# Patient Record
Sex: Male | Born: 1945 | Race: White | Hispanic: No | Marital: Married | Smoking: Current some day smoker
Health system: Southern US, Community
[De-identification: ages and names within clinical notes are randomized; demographics above are authoritative.]

## PROBLEM LIST (undated history)

## (undated) DIAGNOSIS — K2270 Barrett's esophagus without dysplasia: Secondary | ICD-10-CM

## (undated) DIAGNOSIS — K219 Gastro-esophageal reflux disease without esophagitis: Secondary | ICD-10-CM

## (undated) DIAGNOSIS — J309 Allergic rhinitis, unspecified: Secondary | ICD-10-CM

## (undated) DIAGNOSIS — N529 Male erectile dysfunction, unspecified: Secondary | ICD-10-CM

## (undated) DIAGNOSIS — K5790 Diverticulosis of intestine, part unspecified, without perforation or abscess without bleeding: Secondary | ICD-10-CM

## (undated) DIAGNOSIS — Z86010 Personal history of colonic polyps: Secondary | ICD-10-CM

## (undated) DIAGNOSIS — N401 Enlarged prostate with lower urinary tract symptoms: Secondary | ICD-10-CM

## (undated) DIAGNOSIS — Z974 Presence of external hearing-aid: Secondary | ICD-10-CM

## (undated) DIAGNOSIS — K648 Other hemorrhoids: Secondary | ICD-10-CM

## (undated) DIAGNOSIS — Z87448 Personal history of other diseases of urinary system: Secondary | ICD-10-CM

## (undated) DIAGNOSIS — E785 Hyperlipidemia, unspecified: Secondary | ICD-10-CM

## (undated) DIAGNOSIS — K5730 Diverticulosis of large intestine without perforation or abscess without bleeding: Secondary | ICD-10-CM

## (undated) DIAGNOSIS — R42 Dizziness and giddiness: Secondary | ICD-10-CM

## (undated) DIAGNOSIS — Z8489 Family history of other specified conditions: Secondary | ICD-10-CM

## (undated) DIAGNOSIS — Z973 Presence of spectacles and contact lenses: Secondary | ICD-10-CM

## (undated) DIAGNOSIS — Z87442 Personal history of urinary calculi: Secondary | ICD-10-CM

## (undated) DIAGNOSIS — N200 Calculus of kidney: Secondary | ICD-10-CM

## (undated) DIAGNOSIS — D126 Benign neoplasm of colon, unspecified: Secondary | ICD-10-CM

## (undated) DIAGNOSIS — Z860101 Personal history of adenomatous and serrated colon polyps: Secondary | ICD-10-CM

## (undated) DIAGNOSIS — H9313 Tinnitus, bilateral: Secondary | ICD-10-CM

## (undated) HISTORY — DX: Calculus of kidney: N20.0

## (undated) HISTORY — DX: Diverticulosis of intestine, part unspecified, without perforation or abscess without bleeding: K57.90

## (undated) HISTORY — DX: Other hemorrhoids: K64.8

## (undated) HISTORY — DX: Gastro-esophageal reflux disease without esophagitis: K21.9

## (undated) HISTORY — DX: Tinnitus, bilateral: H93.13

## (undated) HISTORY — PX: UPPER GI ENDOSCOPY: SHX6162

## (undated) HISTORY — DX: Barrett's esophagus without dysplasia: K22.70

## (undated) HISTORY — DX: Benign neoplasm of colon, unspecified: D12.6

---

## 1976-09-26 HISTORY — PX: LUMBAR LAMINECTOMY: SHX95

## 1984-09-26 HISTORY — PX: LUMBAR LAMINECTOMY: SHX95

## 2001-12-03 ENCOUNTER — Encounter (INDEPENDENT_AMBULATORY_CARE_PROVIDER_SITE_OTHER): Payer: Self-pay

## 2001-12-03 ENCOUNTER — Ambulatory Visit (HOSPITAL_COMMUNITY): Admission: RE | Admit: 2001-12-03 | Discharge: 2001-12-03 | Payer: Self-pay | Admitting: *Deleted

## 2002-10-03 ENCOUNTER — Ambulatory Visit (HOSPITAL_BASED_OUTPATIENT_CLINIC_OR_DEPARTMENT_OTHER): Admission: RE | Admit: 2002-10-03 | Discharge: 2002-10-04 | Payer: Self-pay | Admitting: Urology

## 2002-10-03 ENCOUNTER — Encounter: Payer: Self-pay | Admitting: Urology

## 2006-04-21 ENCOUNTER — Encounter (INDEPENDENT_AMBULATORY_CARE_PROVIDER_SITE_OTHER): Payer: Self-pay | Admitting: *Deleted

## 2006-04-21 ENCOUNTER — Ambulatory Visit (HOSPITAL_COMMUNITY): Admission: RE | Admit: 2006-04-21 | Discharge: 2006-04-21 | Payer: Self-pay | Admitting: *Deleted

## 2006-09-26 HISTORY — PX: ABDOMINAL HERNIA REPAIR: SHX539

## 2008-01-01 ENCOUNTER — Ambulatory Visit (HOSPITAL_COMMUNITY): Admission: RE | Admit: 2008-01-01 | Discharge: 2008-01-01 | Payer: Self-pay | Admitting: *Deleted

## 2008-01-01 ENCOUNTER — Encounter (INDEPENDENT_AMBULATORY_CARE_PROVIDER_SITE_OTHER): Payer: Self-pay | Admitting: *Deleted

## 2009-03-17 ENCOUNTER — Encounter (INDEPENDENT_AMBULATORY_CARE_PROVIDER_SITE_OTHER): Payer: Self-pay | Admitting: *Deleted

## 2009-03-17 ENCOUNTER — Ambulatory Visit (HOSPITAL_COMMUNITY): Admission: RE | Admit: 2009-03-17 | Discharge: 2009-03-17 | Payer: Self-pay | Admitting: *Deleted

## 2011-02-08 NOTE — Op Note (Signed)
Henry [name], Henry [name]                 ACCOUNT NO.:  [id number]   MEDICAL RECORD NO.:  [id number]          PATIENT TYPE:  AMB   LOCATION:  ENDO                         FACILITY:  [facility]   PHYSICIAN:  Georgiana Spinner, M.D.    DATE OF BIRTH:  [birth year]   DATE OF PROCEDURE:  03/17/2009  DATE OF DISCHARGE:                               OPERATIVE REPORT   PROCEDURE:  Colonoscopy.   INDICATIONS:  Colon polyps.   ANESTHESIA:  Fentanyl 50 mcg, Versed 4 mg.   With the patient mildly sedated in the left lateral decubitus position,  the Pentax videoscopic pediatric colonoscope was inserted in the rectum  and passed under direct vision to the cecum with pressure applied.  The  cecum was identified by ileocecal valve and appendiceal orifice.  We  easily entered into the terminal ileum which appeared normal as well and  was photographed.  From this point, the colonoscope was slowly withdrawn  taking circumferential views of colonic mucosa stopping only at the  hepatic flexure where a polyp was seen, photographed and removed using  hot biopsy forceps technique setting of 20/150 blended current.  We then  withdrew the colonoscope, taking circumferential views of the remaining  colonic mucosa, stopping in the rectum which appeared normal on direct,  showing hemorrhoids on retroflexed view.  The endoscope was straightened  and withdrawn.  The patient's vital signs, pulse oximeter remained  stable.  The patient tolerated procedure well without apparent  complications.   FINDINGS:  Rare diverticulum in the sigmoid colon, internal hemorrhoids  and polyp of hepatic flexure.   PLAN:  Await biopsy report.  The patient will call me for results and  follow-up with me as an outpatient.           ______________________________  Georgiana Spinner, M.D.     GMO/MEDQ  D:  03/17/2009  T:  03/18/2009  Job:  [id number]

## 2011-02-08 NOTE — Op Note (Signed)
NAMEJOACHIM, [name]                 ACCOUNT NO.:  [id number]   MEDICAL RECORD NO.:  [id number]          PATIENT TYPE:  AMB   LOCATION:  ENDO                         FACILITY:  [facility]   PHYSICIAN:  Georgiana Spinner, M.D.    DATE OF BIRTH:  08-29-1946   DATE OF PROCEDURE:  DATE OF DISCHARGE:                               OPERATIVE REPORT   PROCEDURE:  Upper endoscopy.   INDICATIONS:  Gastroesophageal reflux disease with Barrett's esophagus.   ANESTHESIA:  Fentanyl 75 mcg, Versed 8 mg, Benadryl 25 mg.   PROCEDURE:  With the patient mildly sedated in the left lateral  decubitus position, the Pentax videoscopic endoscope was inserted in the  mouth, passed under direct vision through the esophagus which appeared  normal until we reached distal esophagus and there was change of  Barrett's esophagus island that was photographed and subsequently  biopsied.  We entered into the stomach.  The fundus, body, antrum,  duodenal bulb, second portion duodenum were visualized.  From this point  the endoscope was slowly withdrawn, taking circumferential views of  duodenal mucosa until the endoscope had been pulled back into the  stomach placed in retroflexion to view the stomach from below.  The  endoscope was then straightened and withdrawn taking circumferential  views of remaining gastric and esophageal mucosa stopping in the antrum.  Where a reddened area was seen.  At first I thought it might be an ulcer  but appeared just to be a localized redness in the prepyloric area which  I elected to photograph and biopsy.  The scope was then withdrawn taking  circumferential views of the remaining gastric and esophageal mucosa.  The patient's vital signs, pulse oximeter remained stable.  The patient  tolerated the procedure well without apparent complications.   FINDINGS:  Mild erythema of the prepyloric area biopsied.  Barrett's  esophagus biopsied.  Await biopsy report.  The patient will call me for  results and follow-up with me as an outpatient.           ______________________________  Georgiana Spinner, M.D.     GMO/MEDQ  D:  03/17/2009  T:  03/18/2009  Job:  [id number]

## 2011-02-08 NOTE — Op Note (Signed)
NAME:  Henry [name], Henry [name]                 ACCOUNT NO.:  [id number]   MEDICAL RECORD NO.:  [id number]          PATIENT TYPE:  AMB   LOCATION:  NESC                         FACILITY:  [facility]   PHYSICIAN:  Georgiana Spinner, M.D.    DATE OF BIRTH:  December 22, 1945   DATE OF PROCEDURE:  DATE OF DISCHARGE:                               OPERATIVE REPORT   PROCEDURE:  Upper endoscopy.   INDICATIONS:  GERD with a history of Barrett esophagus.   ANESTHESIA:  Fentanyl 75 mcg, Versed 7.5 mg.   PROCEDURE:  With the patient mildly sedated in the left lateral  decubitus position, the Pentax videoscopic endoscope was inserted in the  mouth, passed under direct vision through the esophagus which appeared  normal until we reached distal esophagus, and there was an [place] of  what appeared to be Barrett tissue, photographed and biopsied, and a  second area that extended cephalad from the squamocolumnar junction that  looked like a Y-shaped area of Barrett esophagus.  This too was  photographed and biopsied.  We then entered into the stomach.  Fundus,  body, antrum, duodenal bulb, and second portion of duodenum all appeared  normal.  From this point the endoscope was slowly withdrawn, taking  circumferential views of duodenal mucosa until the endoscope had been  pulled back into stomach, placed in retroflexion to view the stomach  from below.  The endoscope was then straightened and withdrawn, taking  circumferential views of remaining gastric and esophageal mucosa.  The  patient's vital signs and pulse oximeter remained stable.  The patient  tolerated the procedure well without apparent complications.   FINDINGS:  Barrett esophagus with loose wrap of the GE junction  indicating laxity of the lower esophageal sphincter, otherwise an  unremarkable exam.   PLAN:  Await biopsy report.  The patient will call me for results and  follow up with me as needed as an outpatient.            ______________________________  Georgiana Spinner, M.D.     GMO/MEDQ  D:  01/01/2008  T:  01/01/2008  Job:  [id number]

## 2011-02-11 NOTE — Procedures (Signed)
[facility]  Patient:    Henry [name], Henry [name] Visit Number: [id number] MRN: [id number]          Service Type: Attending:  Sabino Gasser, M.D. Dictated by:   Sabino Gasser, M.D. Proc. Date: 12/03/01                             Procedure Report  PROCEDURE:  Endoscopy.  INDICATIONS FOR PROCEDURE:  Reflux.  ANESTHESIA:  Demerol 40, Versed 5 mg.  DESCRIPTION OF PROCEDURE:  With the patient mildly sedated in the left lateral decubitus position, the Olympus video endoscope was inserted in the mouth and passed under direct vision through the esophagus which appeared normal until we reached the distal esophagus and there were some islands of Barretts seen, photographed, and biopsied. We entered into the stomach. The fundus, body, antrum, duodenal bulb and second portion of the duodenum all appeared normal and were photographed. From this point, the endoscope was slowly withdrawn taking circumferential views of the entire duodenal mucosa until the endoscope was then pulled back in the stomach and placed in retroflexion to view the stomach from below. The endoscope was then straightened and withdrawn taking circumferential views of the remaining gastric and esophageal mucosa. The patients vital signs and pulse oximeter remained stable. The patient tolerated the procedure well without apparent complications.  FINDINGS:  Barretts esophagus photographed and biopsied. Await biopsy report. The patient will call me for results and followup with me as an outpatient. Proceed to colonoscopy as planned. Dictated by:   Sabino Gasser, M.D. Attending:  Sabino Gasser, M.D. DD:  12/03/01 TD:  12/03/01 Job: [id number]

## 2011-02-11 NOTE — Op Note (Signed)
NAME:  Henry [name], Henry [name]                           ACCOUNT NO.:  [id number]   MEDICAL RECORD NO.:  [id number]                   PATIENT TYPE:  AMB   LOCATION:  NESC                                 FACILITY:  [facility]   PHYSICIAN:  Excell Seltzer. Annabell Howells, M.D.                 DATE OF BIRTH:  1946/01/08   DATE OF PROCEDURE:  10/03/2002  DATE OF DISCHARGE:                                 OPERATIVE REPORT   PREOPERATIVE DIAGNOSIS:  Right ureteral calculus.   POSTOPERATIVE DIAGNOSIS:  Right ureteral calculus.   PROCEDURES:  1. Cystoscopy.  2. Right retrograde pyelography.  3. Removal of bladder calculus.   SURGEON:  Excell Seltzer. Annabell Howells, M.D.   ASSISTANT:  Crecencio Mc, MD   ANESTHESIA:  General.   COMPLICATIONS:  None.   INDICATION:  [name] is a gentleman, who recently presented with right-  sided flank and after undergoing evaluation, was found to have a right  ureteral calculus in the distal ureter.  After discussing options, the  patient elected to proceed with ureteroscopic removal.  Potential risks and  benefits of this procedure were explained to the patient, and he consented.   DESCRIPTION OF PROCEDURE:  The patient was taken to the operating room, and  a general anesthetic was administered.  The patient was given preoperative  antibiotics and placed in the dorsal lithotomy position and prepped and  draped in the usual sterile fashion.  Next, cystoscopy was performed with  the 22 French sheath.  This revealed no obvious bladder pathology.  Attention was turned to the right ureteral orifice, and a right retrograde  pyelogram was performed utilizing an end-hole catheter.  This demonstrated  no filling defects or dilation.  Inspection of the bladder revealed no  evidence of any bladder tumors or mucosal pathology.  However, a bladder  calculus was seen.  This was most likely the patient's ureteral calculus  that had passed into the bladder.  It was decided to perform a right  retrograde  pyelogram to confirm this diagnosis, as the patient had had a  preoperative KUB that demonstrated his calculus to be in the distal right  ureter.  Therefore, a 6 French end-hole catheter was used to perform  retrograde pyelography.  This demonstrated no evidence of any filling  defects or dilation of the right ureter.  Real-time fluoroscopy was used,  and the ureter was seen to empty without problems.  Therefore, the bladder  calculus was removed from the bladder via the cystoscope sheath.  The  procedure was then ended.  There were no  complications, and the patient appeared to tolerate the procedure well.  He  was able to be transferred to the recovery unit in satisfactory condition.  Please note that Dr. Bjorn Pippin was the operating surgeon and was present  and participated in this entire procedure.     Crecencio Mc, M.D.  Excell Seltzer. Annabell Howells, M.D.    LB/MEDQ  D:  10/03/2002  T:  10/03/2002  Job:  [id number]

## 2011-02-11 NOTE — Op Note (Signed)
NAME:  Henry [name], Henry [name]                 ACCOUNT NO.:  [id number]   MEDICAL RECORD NO.:  [id number]          PATIENT TYPE:  AMB   LOCATION:  ENDO                         FACILITY:  [facility]   PHYSICIAN:  Georgiana Spinner, M.D.    DATE OF BIRTH:  09/04/1946   DATE OF PROCEDURE:  DATE OF DISCHARGE:                                 OPERATIVE REPORT   PROCEDURE:  Upper endoscopy.   INDICATIONS:  GERD.   ANESTHESIA:  Fentanyl 100 mcg, Versed 10 mg.   PROCEDURE:  With the patient mildly sedated, in the left lateral decubitus  position, the Olympus video endoscope was inserted into the mouth and passed  under direct vision through the esophagus, which appeared normal until we  reached the distal esophagus and there appeared to be changes of Barrett's  esophagus, photographed and biopsied.  We entered into the stomach through a  hiatal hernia.  Fundus, body, antrum, duodenal bulb, and second portion of  the duodenum were visualized.  From this point, the endoscope was slowly  withdrawn, taking circumferential views of the duodenal mucosa until the  endoscope had been pulled back into the stomach and placed in retroflexion  to view the stomach from below.  The endoscope was straightened and  withdrawn, taking circumferential views of remaining gastric and esophageal  mucosa.  The patient's vital signs and pulse oximeter remained stable.  The  patient tolerated the procedure well with no apparent complications.   FINDINGS:  Hiatal hernia with Barrett's esophagus, await biopsy report.  The  patient will call me for results and follow up with me as an outpatient to  proceed with colonoscopy.           ______________________________  Georgiana Spinner, M.D.     GMO/MEDQ  D:  04/21/2006  T:  04/22/2006  Job:  [id number]

## 2011-02-11 NOTE — Procedures (Signed)
[facility]  Patient:    CAUSER, Yoltzin R. Visit Number: [id number] MRN: [id number]          Service Type: Attending:  Sabino Gasser, M.D. Dictated by:   Sabino Gasser, M.D. Proc. Date: 12/03/01                             Procedure Report  PROCEDURE:  Colonoscopy.  INDICATION FOR PROCEDURE:  Colon polyps.  ANESTHESIA:  Demerol 20, Versed 1 mg additionally.  DESCRIPTION OF PROCEDURE:  With the patient mildly sedated in the left lateral decubitus position, the Olympus videoscopic colonoscope was inserted in the rectum after a normal rectal exam and passed under direct vision to the cecum. The cecum was identified by the ileocecal valve and appendiceal orifice both of which were photographed. From this point, the colonoscope was slowly withdrawn taking circumferential views of the entire colonic mucosa, stopping only in the rectum which appeared normal in direct and showed hemorrhoid on retroflexed view. The endoscope was straightened and withdrawn. The patients vital signs and pulse oximeter remained stable. The patient tolerated the procedure well without apparent complications.  FINDINGS:  Internal hemorrhoids otherwise unremarkable colonoscopic examination.  PLAN:  Repeat examination in five years. Dictated by:   Sabino Gasser, M.D. Attending:  Sabino Gasser, M.D. DD:  12/03/01 TD:  12/03/01 Job: [id number]

## 2011-02-11 NOTE — Op Note (Signed)
Henry [name], Henry [name]                 ACCOUNT NO.:  [id number]   MEDICAL RECORD NO.:  [id number]          PATIENT TYPE:  AMB   LOCATION:  ENDO                         FACILITY:  [facility]   PHYSICIAN:  Georgiana Spinner, M.D.    DATE OF BIRTH:  04-14-1946   DATE OF PROCEDURE:  04/21/2006  DATE OF DISCHARGE:                                 OPERATIVE REPORT   PROCEDURE:  Colonoscopy.   ENDOSCOPIST:  Georgiana Spinner, M.D.   INDICATIONS:  Colon polyps.   ANESTHESIA:  Fentanyl 25 mcg, Versed 2 mg.   PROCEDURE:  With the patient mildly sedated in the left lateral decubitus  position, a rectal exam was performed which was unremarkable.  Subsequently,  the Olympus videoscopic colonoscope was inserted in the rectum and with  pressure applied, we reached the cecum, identified by ileocecal valve and  appendiceal orifice, both of which were photographed.  From this point, the  colonoscope was slowly withdrawn, taking circumferential views of the  colonic mucosa, stopping in the descending colon, just distal to the splenic  flexure, where a polyp was seen, photographed and removed using snare  cautery technique, setting of 20/200 blended current.  I then used the hot  biopsy forceps to touch the base where the polyp been removed to cauterize  it and then the endoscope was then withdrawn, taking circumferential views  of the remaining colonic mucosa, after we had suctioned the polyp through  the endoscope.  We stopped only in the sigmoid colon to photograph  diverticulosis noted that, until we reached the rectum, which appeared  normal on direct and showed hemorrhoids on retroflexed view.  The endoscope  was straightened and withdrawn.  The patient's vital signs and pulse  oximetry remained stable.  The patient tolerated procedure well without  apparent complications.   FINDINGS:  1.  Internal hemorrhoids.  2.  Diverticulosis of sigmoid colon, mild.  3.  Polyp of descending colon near splenic flexure,  removed, await biopsy      report.  The patient will call me for results and follow up with me as      an outpatient.           ______________________________  Georgiana Spinner, M.D.     GMO/MEDQ  D:  04/21/2006  T:  04/21/2006  Job:  [id number]

## 2011-10-31 ENCOUNTER — Encounter: Payer: Self-pay | Admitting: Gastroenterology

## 2011-11-10 ENCOUNTER — Encounter: Payer: Self-pay | Admitting: Gastroenterology

## 2011-11-10 ENCOUNTER — Ambulatory Visit (INDEPENDENT_AMBULATORY_CARE_PROVIDER_SITE_OTHER): Payer: Medicare Other | Admitting: Gastroenterology

## 2011-11-10 DIAGNOSIS — Z860100 Personal history of colon polyps, unspecified: Secondary | ICD-10-CM | POA: Insufficient documentation

## 2011-11-10 DIAGNOSIS — K2270 Barrett's esophagus without dysplasia: Secondary | ICD-10-CM

## 2011-11-10 MED ORDER — PEG-KCL-NACL-NASULF-NA ASC-C 100 G PO SOLR: 1.0000 | Freq: Once | ORAL | Status: DC

## 2011-11-10 NOTE — Assessment & Plan Note (Signed)
Plan repeat endoscopy.

## 2011-11-10 NOTE — Progress Notes (Signed)
History of Present Illness: Henry [name] is a pleasant 66 year old white male with history of Barrett's esophagus and colon polyps referred at the request of Dr. Renne Crigler for followup procedures. Last endoscopy in 2010 demonstrated Barrett's without dysplasia. He's had recurrent polyps over the years and was last examined in 2010 where adenomatous polyps were seen and removed. He has no GI complaints except for occasional pyrosis if he eats late at night. He remains on omeprazole.    Past Medical History  Diagnosis Date  . Internal hemorrhoids   . Diverticulosis   . Barrett's esophagus   . GERD (gastroesophageal reflux disease)   . Adenomatous colon polyp   . Kidney stone    Past Surgical History  Procedure Date  . Lumbar laminectomy   . Abdominal hernia repair    family history includes Alcohol abuse in his father; Alzheimer's disease in his mother; and Brain cancer in his paternal grandfather. Current Outpatient Prescriptions  Medication Sig Dispense Refill  . omeprazole (PRILOSEC) 20 MG capsule Take 20 mg by mouth daily.      . tadalafil (CIALIS) 20 MG tablet Take 20 mg by mouth as needed.       Allergies as of 11/10/2011  . (No Known Allergies)    reports that he quit smoking about 20 years ago. He has never used smokeless tobacco. He reports that he does not drink alcohol or use illicit drugs.     Review of Systems: Pertinent positive and negative review of systems were noted in the above HPI section. All other review of systems were otherwise negative.  Vital signs were reviewed in today's medical record Physical Exam: General: Well developed , well nourished, no acute distress Head: Normocephalic and atraumatic Eyes:  sclerae anicteric, EOMI Ears: Normal auditory acuity Mouth: No deformity or lesions Neck: Supple, no masses or thyromegaly Lungs: Clear throughout to auscultation Heart: Regular rate and rhythm; no murmurs, rubs or bruits Abdomen: Soft, non tender and non  distended. No masses, hepatosplenomegaly or hernias noted. Normal Bowel sounds Rectal:deferred Musculoskeletal: Symmetrical with no gross deformities  Skin: No lesions on visible extremities Pulses:  Normal pulses noted Extremities: No clubbing, cyanosis, edema or deformities noted Neurological: Alert oriented x 4, grossly nonfocal Cervical Nodes:  No significant cervical adenopathy Inguinal Nodes: No significant inguinal adenopathy Psychological:  Alert and cooperative. Normal mood and affect

## 2011-11-10 NOTE — Assessment & Plan Note (Signed)
Plan colonoscopy 

## 2011-11-29 ENCOUNTER — Encounter: Payer: Self-pay | Admitting: Gastroenterology

## 2011-11-29 ENCOUNTER — Ambulatory Visit (AMBULATORY_SURGERY_CENTER): Payer: Medicare Other | Admitting: Gastroenterology

## 2011-11-29 DIAGNOSIS — K219 Gastro-esophageal reflux disease without esophagitis: Secondary | ICD-10-CM

## 2011-11-29 DIAGNOSIS — Z1211 Encounter for screening for malignant neoplasm of colon: Secondary | ICD-10-CM

## 2011-11-29 DIAGNOSIS — K2270 Barrett's esophagus without dysplasia: Secondary | ICD-10-CM

## 2011-11-29 DIAGNOSIS — Z86010 Personal history of colonic polyps: Secondary | ICD-10-CM

## 2011-11-29 MED ORDER — SODIUM CHLORIDE 0.9 % IV SOLN: 500.0000 mL | INTRAVENOUS | Status: DC

## 2011-11-29 NOTE — Progress Notes (Signed)
Patient did not experience any of the following events: a burn prior to discharge; a fall within the facility; wrong site/side/patient/procedure/implant event; or a hospital transfer or hospital admission upon discharge from the facility. (G8907) Patient did not have preoperative order for IV antibiotic SSI prophylaxis. (G8918)  

## 2011-11-29 NOTE — Op Note (Signed)
[facility] [address] [place], [place]  [address]  COLONOSCOPY PROCEDURE REPORT  PATIENT:  Henry [name], Henry [name]  MR#:  [id number] BIRTHDATE:  March 27, 1946, 66 yrs. old  GENDER:  male ENDOSCOPIST:  Barbette Hair. Arlyce Dice, MD REF. BY:  Soyla Murphy. Renne Crigler, M.D. PROCEDURE DATE:  11/29/2011 PROCEDURE:  Diagnostic Colonoscopy ASA CLASS:  Class II INDICATIONS:  Screening, history of pre-cancerous (adenomatous) colon polyps 2010 colo - adenomatous polyps MEDICATIONS:   MAC sedation, administered by CRNA propofol 300mg IV  DESCRIPTION OF PROCEDURE:   After the risks benefits and alternatives of the procedure were thoroughly explained, informed consent was obtained.  Digital rectal exam was performed and revealed no abnormalities.   The LB CF-H180AL [id number] endoscope was introduced through the anus and advanced to the cecum, which was identified by the ileocecal valve, limited by poor preparation.  Moderate amount of retained liquid stool  The quality of the prep was Moviprep fair.  The instrument was then slowly withdrawn as the colon was fully examined. <<PROCEDUREIMAGES>>  FINDINGS:  Moderate diverticulosis was found in the sigmoid colon (see image4).  This was otherwise a normal examination of the colon (see image1, image2, and image5).   Retroflexed views in the rectum revealed no abnormalities.    The time to cecum =  1) 4.25 minutes. The scope was then withdrawn in  1) 7.0  minutes from the cecum and the procedure completed. COMPLICATIONS:  None ENDOSCOPIC IMPRESSION: 1) Moderate diverticulosis in the sigmoid colon 2) Otherwise normal examination RECOMMENDATIONS: 1) Colonoscopy in 5 years in view of slight limitation of exam due to prep REPEAT EXAM:  In 5 year(s) for Colonoscopy.  ______________________________ Barbette Hair. Arlyce Dice, MD  CC:  n. eSIGNED:   Barbette Hair. Eliaz Fout at 11/29/2011 02:54 PM  Gloriann Loan, [phone]

## 2011-11-29 NOTE — Patient Instructions (Signed)

## 2011-11-30 ENCOUNTER — Telehealth: Payer: Self-pay | Admitting: *Deleted

## 2011-11-30 NOTE — Telephone Encounter (Signed)
  Follow up Call-  Call back number 11/29/2011  Post procedure Call Back phone  # [phone]  Permission to leave phone message Yes     Patient questions:  Do you have a fever, pain , or abdominal swelling? no Pain Score  0 *  Have you tolerated food without any problems? yes  Have you been able to return to your normal activities? yes  Do you have any questions about your discharge instructions: Diet   no Medications  no Follow up visit  no  Do you have questions or concerns about your Care? no  Actions: * If pain score is 4 or above: No action needed, pain <4. Pt states went home and ate a big salad and had bloating and gas from the salad but passed all the air and felt fine. States everything here was great and he appreciates all we all did for him. ewm

## 2011-11-30 NOTE — Op Note (Signed)
[facility] [address] [place], [place]  [address]  ENDOSCOPY PROCEDURE REPORT  PATIENT:  Henry [name], Henry [name]  MR#:  [id number] BIRTHDATE:  15-Sep-1946, 66 yrs. old  GENDER:  male  ENDOSCOPIST:  Barbette Hair. Arlyce Dice, MD Referred by:  Soyla Murphy. Renne Crigler, M.D.  PROCEDURE DATE:  11/29/2011 PROCEDURE:  EGD with biopsy, [id number] ASA CLASS:  Class II INDICATIONS:  h/o Barrett's Esophagus  MEDICATIONS:   There was residual sedation effect present from prior procedure., MAC sedation, administered by CRNA propofol 100mg IV, glycopyrrolate (Robinal) 0.2 mg IV, 0.6cc simethancone 0.6 cc PO TOPICAL ANESTHETIC:  DESCRIPTION OF PROCEDURE:   After the risks and benefits of the procedure were explained, informed consent was obtained.  The LB GIF-H180 [id number] endoscope was introduced through the mouth and advanced to the third portion of the duodenum.  The instrument was slowly withdrawn as the mucosa was fully examined. <<PROCEDUREIMAGES>>  Barrett's esophagus was found at the gastroesophageal junction. Short segment Barrett's characterized by slightly irregular GE junction. Multiple biopsies were taken (see image1 and image4). Otherwise the examination was normal (see image2 and image3). Retroflexed views revealed no abnormalities.    The scope was then withdrawn from the patient and the procedure completed.  COMPLICATIONS:  None  ENDOSCOPIC IMPRESSION: 1) Barrett's esophagus at the gastroesophageal junction 2) Otherwise normal examination RECOMMENDATIONS: 1) Await biopsy results  ______________________________ Barbette Hair. Arlyce Dice, MD  CC:  n. eSIGNED:   Barbette Hair. Edgel Degnan at 11/29/2011 03:14 PM  Gloriann Loan, [phone]

## 2011-12-05 ENCOUNTER — Encounter: Payer: Self-pay | Admitting: Gastroenterology

## 2013-07-24 ENCOUNTER — Ambulatory Visit (INDEPENDENT_AMBULATORY_CARE_PROVIDER_SITE_OTHER): Payer: Medicare Other | Admitting: Gastroenterology

## 2013-07-24 ENCOUNTER — Encounter: Payer: Self-pay | Admitting: Gastroenterology

## 2013-07-24 VITALS — BP 110/70 | HR 80 | Ht 66.0 in | Wt 149.2 lb

## 2013-07-24 DIAGNOSIS — K2270 Barrett's esophagus without dysplasia: Secondary | ICD-10-CM

## 2013-07-24 DIAGNOSIS — Z86010 Personal history of colonic polyps: Secondary | ICD-10-CM

## 2013-07-24 DIAGNOSIS — R498 Other voice and resonance disorders: Secondary | ICD-10-CM

## 2013-07-24 DIAGNOSIS — R499 Unspecified voice and resonance disorder: Secondary | ICD-10-CM

## 2013-07-24 NOTE — Assessment & Plan Note (Signed)
Plan followup endoscopy 2016

## 2013-07-24 NOTE — Assessment & Plan Note (Signed)
Plan followup colonoscopy 2018 

## 2013-07-24 NOTE — Progress Notes (Signed)
History of Present Illness: Henry [name] has returned for evaluation of hoarseness.  He has a history of Barrett's esophagus and colon polyps.  Colonoscopy in 2013 was normal although limited because of his prep.  Endoscopy demonstrated inflammatory changes only in the distal esophagus.  On omeprazole he has no symptoms of pyrosis.  His voice has become raspy and the family is concerned that this could be a GI problem.  He denies sore throat or regurgitation of gastric contents.  Aside from the slight change in his voice, he has no other complaints.    Past Medical History  Diagnosis Date  . Internal hemorrhoids   . Diverticulosis   . Barrett's esophagus   . GERD (gastroesophageal reflux disease)   . Adenomatous colon polyp   . Kidney stone    Past Surgical History  Procedure Laterality Date  . Lumbar laminectomy    . Abdominal hernia repair     family history includes Alcohol abuse in his father; Alzheimer's disease in his mother; Brain cancer in his paternal grandfather. Current Outpatient Prescriptions  Medication Sig Dispense Refill  . acetaminophen (TYLENOL) 325 MG tablet Take 650 mg by mouth every 6 (six) hours as needed.      . Cholecalciferol 5000 UNITS capsule Take 10,000 Units by mouth daily.      . Glucosamine Sulfate 1000 MG TABS Take 1 tablet by mouth daily.      . Melatonin 5 MG TABS Take 1 tablet by mouth at bedtime.      Henry [name] Kitchen omeprazole (PRILOSEC) 20 MG capsule Take 20 mg by mouth daily.      Henry [name] Kitchen Phytosterol-Fish Oil-EPA-DHA 500 MG CAPS Take 4 capsules by mouth daily.      . tadalafil (CIALIS) 20 MG tablet Take 20 mg by mouth as needed.       No current facility-administered medications for this visit.   Allergies as of 07/24/2013  . (No Known Allergies)    reports that he quit smoking about 21 years ago. He has never used smokeless tobacco. He reports that he does not drink alcohol or use illicit drugs.     Review of Systems: Pertinent positive and negative review of  systems were noted in the above HPI section. All other review of systems were otherwise negative.  Vital signs were reviewed in today's medical record Physical Exam: General: Well developed , well nourished, no acute distress Skin: anicteric Head: Normocephalic and atraumatic Eyes:  sclerae anicteric, EOMI Ears: Normal auditory acuity Mouth: No deformity or lesions Neck: Supple, no masses or thyromegaly Lungs: Clear throughout to auscultation Heart: Regular rate and rhythm; no murmurs, rubs or bruits Abdomen: Soft, non tender and non distended. No masses, hepatosplenomegaly or hernias noted. Normal Bowel sounds Rectal:deferred Musculoskeletal: Symmetrical with no gross deformities  Skin: No lesions on visible extremities Pulses:  Normal pulses noted Extremities: No clubbing, cyanosis, edema or deformities noted Neurological: Alert oriented x 4, grossly nonfocal Cervical Nodes:  No significant cervical adenopathy Inguinal Nodes: No significant inguinal adenopathy Psychological:  Alert and cooperative. Normal mood and affect

## 2013-07-24 NOTE — Assessment & Plan Note (Signed)
Symptoms are relatively mild and may represent allergies or postnasal drip.  I think it is unlikely that the hoarseness is due to 2 reflux, in the absence of other symptoms including pyrosis or sore throat.  Recommendations #1 if hoarseness continues or worsens I suggested that he undergo a formal ENT examination.

## 2013-07-24 NOTE — Patient Instructions (Signed)
Follow up as needed

## 2014-12-05 ENCOUNTER — Encounter: Payer: Self-pay | Admitting: Gastroenterology

## 2014-12-22 ENCOUNTER — Encounter: Payer: Self-pay | Admitting: Gastroenterology

## 2014-12-23 ENCOUNTER — Encounter: Payer: Self-pay | Admitting: Gastroenterology

## 2015-02-06 ENCOUNTER — Ambulatory Visit (AMBULATORY_SURGERY_CENTER): Payer: Self-pay | Admitting: *Deleted

## 2015-02-06 VITALS — Ht 66.0 in | Wt 153.0 lb

## 2015-02-06 DIAGNOSIS — Z8719 Personal history of other diseases of the digestive system: Secondary | ICD-10-CM

## 2015-02-06 NOTE — Progress Notes (Signed)
No egg or soy allergy. No anesthesia problems.  No home O2.  No diet meds.  

## 2015-02-20 ENCOUNTER — Encounter: Payer: Self-pay | Admitting: Gastroenterology

## 2015-02-20 ENCOUNTER — Ambulatory Visit (AMBULATORY_SURGERY_CENTER): Payer: PPO | Admitting: Gastroenterology

## 2015-02-20 VITALS — BP 131/79 | HR 63 | Temp 97.6°F | Resp 18 | Ht 66.0 in | Wt 149.0 lb

## 2015-02-20 DIAGNOSIS — R499 Unspecified voice and resonance disorder: Secondary | ICD-10-CM | POA: Diagnosis not present

## 2015-02-20 DIAGNOSIS — K2270 Barrett's esophagus without dysplasia: Secondary | ICD-10-CM

## 2015-02-20 DIAGNOSIS — K208 Other esophagitis: Secondary | ICD-10-CM | POA: Diagnosis not present

## 2015-02-20 MED ORDER — SODIUM CHLORIDE 0.9 % IV SOLN
500.0000 mL | INTRAVENOUS | Status: DC
Start: 2015-02-20 — End: 2015-02-26

## 2015-02-20 NOTE — Progress Notes (Signed)
Called to room to assist during endoscopic procedure.  Patient ID and intended procedure confirmed with present staff. Received instructions for my participation in the procedure from the performing physician.  

## 2015-02-20 NOTE — Op Note (Signed)
[place] [facility]  [address] [place], [id number]   ENDOSCOPY PROCEDURE REPORT  PATIENT: Henry [name], Henry [name]  MR#: [id number] BIRTHDATE: 1946-08-06 , 69  yrs. old GENDER: male ENDOSCOPIST: Inda Castle, MD REFERRED BY:  Alden Server, M.D. PROCEDURE DATE:  02/20/2015 PROCEDURE:  EGD w/ biopsy ASA CLASS:     Class II INDICATIONS:  history of Barrett's esophagus. MEDICATIONS: Monitored anesthesia care and Propofol 200 mg IV TOPICAL ANESTHETIC:  DESCRIPTION OF PROCEDURE: After the risks benefits and alternatives of the procedure were thoroughly explained, informed consent was obtained.  The LB [id number] endoscope was introduced through the mouth and advanced to the second portion of the duodenum , Without limitations.  The instrument was slowly withdrawn as the mucosa was fully examined.    ESOPHAGUS: There was a 1cm segment of Barrett's esophagus without dysplasia found at the gastroesophageal junction.  The length of circumferential Barrett's was 1cm ([place] C1).  There was no nodular mucosa noted in the Barrett's segment.  Multiple biopsies were performed using cold forceps.   Except for the findings listed, the EGD was otherwise normal.  Retroflexed views revealed no abnormalities.     The scope was then withdrawn from the patient and the procedure completed.  COMPLICATIONS: There were no immediate complications.  ENDOSCOPIC IMPRESSION: 1.   There was a 1cm segment of Barrett's esophagus w/o dysplasia found at the gastroesophageal junction; multiple biopsies were performed 2.   EGD was otherwise normal  RECOMMENDATIONS: 1.  Await biopsy results 2.  Continue PPI - omeprazole  REPEAT EXAM:  eSigned:  Inda Castle, MD 02/20/2015 1:33 PM    CC:

## 2015-02-20 NOTE — Progress Notes (Signed)
Candie Chroman RN documented under my name for training-adm

## 2015-02-20 NOTE — Patient Instructions (Signed)
YOU HAD AN ENDOSCOPIC PROCEDURE TODAY AT [facility]:   Refer to the procedure report that was given to you for any specific questions about what was found during the examination.  If the procedure report does not answer your questions, please call your gastroenterologist to clarify.  If you requested that your care partner not be given the details of your procedure findings, then the procedure report has been included in a sealed envelope for you to review at your convenience later.  YOU SHOULD EXPECT: Some feelings of bloating in the abdomen. Passage of more gas than usual.  Walking can help get rid of the air that was put into your GI tract during the procedure and reduce the bloating. If you had a lower endoscopy (such as a colonoscopy or flexible sigmoidoscopy) you may notice spotting of blood in your stool or on the toilet paper. If you underwent a bowel prep for your procedure, you may not have a normal bowel movement for a few days.  Please Note:  You might notice some irritation and congestion in your nose or some drainage.  This is from the oxygen used during your procedure.  There is no need for concern and it should clear up in a day or so.  SYMPTOMS TO REPORT IMMEDIATELY:   Following upper endoscopy (EGD)  Vomiting of blood or coffee ground material  New chest pain or pain under the shoulder blades  Painful or persistently difficult swallowing  New shortness of breath  Fever of 100F or higher  Black, tarry-looking stools  For urgent or emergent issues, a gastroenterologist can be reached at any hour by calling [phone].   DIET: Your first meal following the procedure should be a small meal and then it is ok to progress to your normal diet. Heavy or fried foods are harder to digest and may make you feel nauseous or bloated.  Likewise, meals heavy in dairy and vegetables can increase bloating.  Drink plenty of fluids but you should avoid alcoholic beverages for  24 hours.  ACTIVITY:  You should plan to take it easy for the rest of today and you should NOT DRIVE or use heavy machinery until tomorrow (because of the sedation medicines used during the test).    FOLLOW UP: Our staff will call the number listed on your records the next business day following your procedure to check on you and address any questions or concerns that you may have regarding the information given to you following your procedure. If we do not reach you, we will leave a message.  However, if you are feeling well and you are not experiencing any problems, there is no need to return our call.  We will assume that you have returned to your regular daily activities without incident.  If any biopsies were taken you will be contacted by phone or by letter within the next 1-3 weeks.  Please call us at [phone] if you have not heard about the biopsies in 3 weeks.    SIGNATURES/CONFIDENTIALITY: You and/or your care partner have signed paperwork which will be entered into your electronic medical record.  These signatures attest to the fact that that the information above on your After Visit Summary has been reviewed and is understood.  Full responsibility of the confidentiality of this discharge information lies with you and/or your care-partner.  Wait biopsy results and continue Omeprazole

## 2015-02-20 NOTE — Progress Notes (Signed)
Report to PACU, RN, vss, BBS= Clear.  

## 2015-02-24 ENCOUNTER — Telehealth: Payer: Self-pay | Admitting: *Deleted

## 2015-02-24 NOTE — Telephone Encounter (Signed)
Name identifier, left message, follow-up 

## 2015-03-02 ENCOUNTER — Encounter: Payer: Self-pay | Admitting: Gastroenterology

## 2015-03-10 ENCOUNTER — Telehealth: Payer: Self-pay | Admitting: Gastroenterology

## 2015-03-10 NOTE — Telephone Encounter (Signed)
Advised the results are not yet reviewed by the doctor.

## 2016-09-27 DIAGNOSIS — R69 Illness, unspecified: Secondary | ICD-10-CM | POA: Diagnosis not present

## 2016-10-13 DIAGNOSIS — R69 Illness, unspecified: Secondary | ICD-10-CM | POA: Diagnosis not present

## 2016-11-10 ENCOUNTER — Encounter: Payer: Self-pay | Admitting: Gastroenterology

## 2016-11-21 ENCOUNTER — Encounter: Payer: Self-pay | Admitting: Gastroenterology

## 2017-01-03 ENCOUNTER — Encounter: Payer: Self-pay | Admitting: Gastroenterology

## 2017-01-03 ENCOUNTER — Ambulatory Visit (AMBULATORY_SURGERY_CENTER): Payer: Self-pay

## 2017-01-03 VITALS — Ht 66.0 in | Wt 152.8 lb

## 2017-01-03 DIAGNOSIS — Z86010 Personal history of colonic polyps: Secondary | ICD-10-CM

## 2017-01-03 DIAGNOSIS — R69 Illness, unspecified: Secondary | ICD-10-CM | POA: Diagnosis not present

## 2017-01-03 MED ORDER — NA SULFATE-K SULFATE-MG SULF 17.5-3.13-1.6 GM/177ML PO SOLN: ORAL | 0 refills | Status: DC

## 2017-01-03 NOTE — Progress Notes (Signed)
Per pt, no allergies to soy or egg products.Pt not taking any weight loss meds or using  O2 at home. 

## 2017-01-17 ENCOUNTER — Ambulatory Visit (AMBULATORY_SURGERY_CENTER): Payer: Medicare HMO | Admitting: Gastroenterology

## 2017-01-17 ENCOUNTER — Encounter: Payer: Self-pay | Admitting: Gastroenterology

## 2017-01-17 VITALS — BP 121/74 | HR 63 | Temp 98.4°F | Resp 10 | Ht 66.0 in | Wt 149.0 lb

## 2017-01-17 DIAGNOSIS — K2270 Barrett's esophagus without dysplasia: Secondary | ICD-10-CM | POA: Diagnosis not present

## 2017-01-17 DIAGNOSIS — D122 Benign neoplasm of ascending colon: Secondary | ICD-10-CM

## 2017-01-17 DIAGNOSIS — Z1212 Encounter for screening for malignant neoplasm of rectum: Secondary | ICD-10-CM

## 2017-01-17 DIAGNOSIS — Z1211 Encounter for screening for malignant neoplasm of colon: Secondary | ICD-10-CM

## 2017-01-17 DIAGNOSIS — Z86010 Personal history of colonic polyps: Secondary | ICD-10-CM | POA: Diagnosis not present

## 2017-01-17 DIAGNOSIS — R490 Dysphonia: Secondary | ICD-10-CM | POA: Diagnosis not present

## 2017-01-17 HISTORY — PX: COLONOSCOPY: SHX174

## 2017-01-17 MED ORDER — SODIUM CHLORIDE 0.9 % IV SOLN: 500.0000 mL | INTRAVENOUS | Status: DC

## 2017-01-17 NOTE — Patient Instructions (Addendum)
Handouts given on diverticulosis and polyps   YOU HAD AN ENDOSCOPIC PROCEDURE TODAY: Refer to the procedure report and other information in the discharge instructions given to you for any specific questions about what was found during the examination. If this information does not answer your questions, please call [place] office at [phone] to clarify.   YOU SHOULD EXPECT: Some feelings of bloating in the abdomen. Passage of more gas than usual. Walking can help get rid of the air that was put into your GI tract during the procedure and reduce the bloating. If you had a lower endoscopy (such as a colonoscopy or flexible sigmoidoscopy) you may notice spotting of blood in your stool or on the toilet paper. Some abdominal soreness may be present for a day or two, also.  DIET: Your first meal following the procedure should be a light meal and then it is ok to progress to your normal diet. A half-sandwich or bowl of soup is an example of a good first meal. Heavy or fried foods are harder to digest and may make you feel nauseous or bloated. Drink plenty of fluids but you should avoid alcoholic beverages for 24 hours. If you had a esophageal dilation, please see attached instructions for diet.    ACTIVITY: Your care partner should take you home directly after the procedure. You should plan to take it easy, moving slowly for the rest of the day. You can resume normal activity the day after the procedure however YOU SHOULD NOT DRIVE, use power tools, machinery or perform tasks that involve climbing or major physical exertion for 24 hours (because of the sedation medicines used during the test).   SYMPTOMS TO REPORT IMMEDIATELY: A gastroenterologist can be reached at any hour. Please call [phone]  for any of the following symptoms:  Following lower endoscopy (colonoscopy, flexible sigmoidoscopy) Excessive amounts of blood in the stool  Significant tenderness, worsening of abdominal pains  Swelling of  the abdomen that is new, acute  Fever of 100 or higher    FOLLOW UP:  If any biopsies were taken you will be contacted by phone or by letter within the next 1-3 weeks. Call [phone]  if you have not heard about the biopsies in 3 weeks.  Please also call with any specific questions about appointments or follow up tests.

## 2017-01-17 NOTE — Progress Notes (Signed)
Called to room to assist during endoscopic procedure.  Patient ID and intended procedure confirmed with present staff. Received instructions for my participation in the procedure from the performing physician.  

## 2017-01-17 NOTE — Progress Notes (Signed)
To PACU Pt awake and alert. Report to RN 

## 2017-01-17 NOTE — Op Note (Signed)
[facility] Patient Name: Henry [name] Procedure Date: 01/17/2017 2:01 PM MRN: [id number] Endoscopist: Remo Lipps P. Farhan Jean MD, MD Age: 71 Referring MD:  Date of Birth: 1946/06/16 Gender: Male Account #: [id number] Procedure:                Colonoscopy Indications:              Screening for colorectal malignant neoplasm (last                            exam 5 yrs ago limited by fair prep) Medicines:                Monitored Anesthesia Care Procedure:                Pre-Anesthesia Assessment:                           - Prior to the procedure, a History and Physical                            was performed, and patient medications and                            allergies were reviewed. The patient's tolerance of                            previous anesthesia was also reviewed. The risks                            and benefits of the procedure and the sedation                            options and risks were discussed with the patient.                            All questions were answered, and informed consent                            was obtained. Prior Anticoagulants: The patient has                            taken no previous anticoagulant or antiplatelet                            agents. ASA Grade Assessment: II - A patient with                            mild systemic disease. After reviewing the risks                            and benefits, the patient was deemed in                            satisfactory condition to undergo the procedure.  After obtaining informed consent, the colonoscope                            was passed under direct vision. Throughout the                            procedure, the patient's blood pressure, pulse, and                            oxygen saturations were monitored continuously. The                            Colonoscope was introduced through the anus and                            advanced to the the  cecum, identified by                            appendiceal orifice and ileocecal valve. The                            colonoscopy was performed without difficulty. The                            patient tolerated the procedure well. The quality                            of the bowel preparation was adequate. The                            ileocecal valve, appendiceal orifice, and rectum                            were photographed. Scope In: 2:10:33 PM Scope Out: 2:30:02 PM Scope Withdrawal Time: 0 hours 16 minutes 19 seconds  Total Procedure Duration: 0 hours 19 minutes 29 seconds  Findings:                 The perianal and digital rectal examinations were                            normal.                           A 6 mm polyp was found in the ascending colon. The                            polyp was sessile. The polyp was removed with a                            cold snare. Resection and retrieval were complete.                           Multiple medium-mouthed diverticula were found in  the sigmoid colon and descending colon.                           Internal hemorrhoids were found during retroflexion.                           Anal papilla(e) were hypertrophied.                           The colon had residual liquid stool, several                            minutes spent lavaging the colon to obtain adequate                            views. The exam was otherwise without abnormality. Complications:            No immediate complications. Estimated blood loss:                            Minimal. Estimated Blood Loss:     Estimated blood loss was minimal. Impression:               - One 6 mm polyp in the ascending colon, removed                            with a cold snare. Resected and retrieved.                           - Diverticulosis in the sigmoid colon and in the                            descending colon.                           - Internal  hemorrhoids.                           - Anal papilla(e) were hypertrophied.                           - The examination was otherwise normal. Recommendation:           - Patient has a contact number available for                            emergencies. The signs and symptoms of potential                            delayed complications were discussed with the                            patient. Return to normal activities tomorrow.                            Written discharge instructions were provided to the  patient.                           - Resume previous diet.                           - Continue present medications.                           - Await pathology results.                           - Repeat colonoscopy is recommended for                            surveillance. The colonoscopy date will be                            determined after pathology results from today's                            exam become available for review.                           - No ibuprofen, naproxen, or other non-steroidal                            anti-inflammatory drugs for 2 weeks after polyp                            removal. Remo Lipps P. Auria Mckinlay MD, MD 01/17/2017 2:34:57 PM This report has been signed electronically.

## 2017-01-18 ENCOUNTER — Telehealth: Payer: Self-pay

## 2017-01-18 NOTE — Telephone Encounter (Signed)
  Follow up Call-  Call back number 01/17/2017 02/20/2015  Post procedure Call Back phone  # [phone]  Permission to leave phone message Yes Yes  Some recent data might be hidden     Patient questions:  Do you have a fever, pain , or abdominal swelling? No. Pain Score  0 *  Have you tolerated food without any problems? Yes.    Have you been able to return to your normal activities? Yes.    Do you have any questions about your discharge instructions: Diet   No. Medications  No. Follow up visit  No.  Do you have questions or concerns about your Care? No.  Actions: * If pain score is 4 or above: No action needed, pain <4.

## 2017-01-18 NOTE — Telephone Encounter (Signed)
  Follow up Call-  Call back number 01/17/2017 02/20/2015  Post procedure Call Back phone  # [phone]  Permission to leave phone message Yes Yes  Some recent data might be hidden     Left message

## 2017-01-20 ENCOUNTER — Encounter: Payer: Self-pay | Admitting: Gastroenterology

## 2017-01-26 DIAGNOSIS — R69 Illness, unspecified: Secondary | ICD-10-CM | POA: Diagnosis not present

## 2017-02-01 DIAGNOSIS — R69 Illness, unspecified: Secondary | ICD-10-CM | POA: Diagnosis not present

## 2017-03-07 DIAGNOSIS — L603 Nail dystrophy: Secondary | ICD-10-CM | POA: Diagnosis not present

## 2017-03-07 DIAGNOSIS — B351 Tinea unguium: Secondary | ICD-10-CM | POA: Diagnosis not present

## 2017-05-18 DIAGNOSIS — H9313 Tinnitus, bilateral: Secondary | ICD-10-CM | POA: Diagnosis not present

## 2017-06-19 DIAGNOSIS — N529 Male erectile dysfunction, unspecified: Secondary | ICD-10-CM | POA: Diagnosis not present

## 2017-07-03 DIAGNOSIS — Z23 Encounter for immunization: Secondary | ICD-10-CM | POA: Diagnosis not present

## 2017-07-03 DIAGNOSIS — N529 Male erectile dysfunction, unspecified: Secondary | ICD-10-CM | POA: Diagnosis not present

## 2017-07-03 DIAGNOSIS — Z0000 Encounter for general adult medical examination without abnormal findings: Secondary | ICD-10-CM | POA: Diagnosis not present

## 2017-07-03 DIAGNOSIS — E7800 Pure hypercholesterolemia, unspecified: Secondary | ICD-10-CM | POA: Diagnosis not present

## 2017-07-06 DIAGNOSIS — N400 Enlarged prostate without lower urinary tract symptoms: Secondary | ICD-10-CM | POA: Diagnosis not present

## 2017-07-06 DIAGNOSIS — Z0000 Encounter for general adult medical examination without abnormal findings: Secondary | ICD-10-CM | POA: Diagnosis not present

## 2017-07-06 DIAGNOSIS — N529 Male erectile dysfunction, unspecified: Secondary | ICD-10-CM | POA: Diagnosis not present

## 2017-07-06 DIAGNOSIS — K219 Gastro-esophageal reflux disease without esophagitis: Secondary | ICD-10-CM | POA: Diagnosis not present

## 2017-07-06 DIAGNOSIS — Z789 Other specified health status: Secondary | ICD-10-CM | POA: Diagnosis not present

## 2017-07-06 DIAGNOSIS — Z125 Encounter for screening for malignant neoplasm of prostate: Secondary | ICD-10-CM | POA: Diagnosis not present

## 2017-07-06 DIAGNOSIS — E7800 Pure hypercholesterolemia, unspecified: Secondary | ICD-10-CM | POA: Diagnosis not present

## 2017-07-06 DIAGNOSIS — G4700 Insomnia, unspecified: Secondary | ICD-10-CM | POA: Diagnosis not present

## 2017-07-10 DIAGNOSIS — R69 Illness, unspecified: Secondary | ICD-10-CM | POA: Diagnosis not present

## 2017-07-17 DIAGNOSIS — R69 Illness, unspecified: Secondary | ICD-10-CM | POA: Diagnosis not present

## 2017-08-02 DIAGNOSIS — R351 Nocturia: Secondary | ICD-10-CM | POA: Diagnosis not present

## 2017-08-02 DIAGNOSIS — N5201 Erectile dysfunction due to arterial insufficiency: Secondary | ICD-10-CM | POA: Diagnosis not present

## 2017-11-03 DIAGNOSIS — H2513 Age-related nuclear cataract, bilateral: Secondary | ICD-10-CM | POA: Diagnosis not present

## 2018-02-08 ENCOUNTER — Encounter: Payer: Self-pay | Admitting: Gastroenterology

## 2018-02-16 ENCOUNTER — Encounter: Payer: Self-pay | Admitting: Gastroenterology

## 2018-02-21 DIAGNOSIS — R69 Illness, unspecified: Secondary | ICD-10-CM | POA: Diagnosis not present

## 2018-02-21 DIAGNOSIS — B351 Tinea unguium: Secondary | ICD-10-CM | POA: Diagnosis not present

## 2018-02-26 DIAGNOSIS — R69 Illness, unspecified: Secondary | ICD-10-CM | POA: Diagnosis not present

## 2018-03-01 DIAGNOSIS — R69 Illness, unspecified: Secondary | ICD-10-CM | POA: Diagnosis not present

## 2018-03-28 ENCOUNTER — Ambulatory Visit (AMBULATORY_SURGERY_CENTER): Payer: Self-pay

## 2018-03-28 ENCOUNTER — Other Ambulatory Visit: Payer: Self-pay

## 2018-03-28 VITALS — Ht 66.0 in | Wt 152.4 lb

## 2018-03-28 DIAGNOSIS — K2270 Barrett's esophagus without dysplasia: Secondary | ICD-10-CM

## 2018-03-28 NOTE — Progress Notes (Signed)
No egg or soy allergy known to patient  No issues with past sedation with any surgeries  or procedures, no intubation problems  No diet pills per patient No home 02 use per patient  No blood thinners per patient  Pt denies issues with constipation  No A fib or A flutter  EMMI video sent to pt's e mail , pt declined    

## 2018-04-09 DIAGNOSIS — R69 Illness, unspecified: Secondary | ICD-10-CM | POA: Diagnosis not present

## 2018-04-12 ENCOUNTER — Ambulatory Visit (AMBULATORY_SURGERY_CENTER): Payer: Medicare HMO | Admitting: Gastroenterology

## 2018-04-12 ENCOUNTER — Other Ambulatory Visit: Payer: Self-pay

## 2018-04-12 ENCOUNTER — Encounter: Payer: Self-pay | Admitting: Gastroenterology

## 2018-04-12 VITALS — BP 103/77 | HR 59 | Temp 98.9°F | Resp 20 | Ht 66.0 in | Wt 152.0 lb

## 2018-04-12 DIAGNOSIS — K219 Gastro-esophageal reflux disease without esophagitis: Secondary | ICD-10-CM | POA: Diagnosis not present

## 2018-04-12 DIAGNOSIS — K317 Polyp of stomach and duodenum: Secondary | ICD-10-CM

## 2018-04-12 DIAGNOSIS — K2270 Barrett's esophagus without dysplasia: Secondary | ICD-10-CM

## 2018-04-12 HISTORY — PX: COLONOSCOPY: SHX174

## 2018-04-12 MED ORDER — SODIUM CHLORIDE 0.9 % IV SOLN: 500.0000 mL | Freq: Once | INTRAVENOUS | Status: DC

## 2018-04-12 NOTE — Progress Notes (Signed)
A and O x3. Report to RN. Tolerated MAC anesthesia well.Teeth unchanged after procedure.

## 2018-04-12 NOTE — Progress Notes (Signed)
Pt's states no medical or surgical changes since previsit or office visit. 

## 2018-04-12 NOTE — Op Note (Signed)
[facility] Patient Name: Henry [name] Procedure Date: 04/12/2018 10:43 AM MRN: [id number] Endoscopist: Remo Lipps P. Havery Moros , MD Age: 72 Referring MD:  Date of Birth: 1946/02/05 Gender: Male Account #: [id number] Procedure:                Upper GI endoscopy Indications:              Surveillance for malignancy due to personal history                            of Barrett's esophagus Medicines:                Monitored Anesthesia Care Procedure:                Pre-Anesthesia Assessment:                           - Prior to the procedure, a History and Physical                            was performed, and patient medications and                            allergies were reviewed. The patient's tolerance of                            previous anesthesia was also reviewed. The risks                            and benefits of the procedure and the sedation                            options and risks were discussed with the patient.                            All questions were answered, and informed consent                            was obtained. Prior Anticoagulants: The patient has                            taken no previous anticoagulant or antiplatelet                            agents. ASA Grade Assessment: II - A patient with                            mild systemic disease. After reviewing the risks                            and benefits, the patient was deemed in                            satisfactory condition to undergo the procedure.  After obtaining informed consent, the endoscope was                            passed under direct vision. Throughout the                            procedure, the patient's blood pressure, pulse, and                            oxygen saturations were monitored continuously. The                            Model GIF-[id number]) scope was introduced                            through the mouth, and  advanced to the second part                            of duodenum. The upper GI endoscopy was                            accomplished without difficulty. The patient                            tolerated the procedure well. Scope In: Scope Out: Findings:                 Esophagogastric landmarks were identified: the                            Z-line was found at 34 cm, the gastroesophageal                            junction was found at 34 cm and the upper extent of                            the gastric folds was found at 39 cm from the                            incisors.                           A 5 cm hiatal hernia was present.                           The Z-line was irregular and was found 34 cm from                            the incisors, with a thin extension / tongue of                            salmon colored roughly 78mm in length, and a few mm  of extension of salmon colored mucosa                            circumferentially above the GEJ. Biopsies were                            taken with a cold forceps for histology.                           The exam of the esophagus was otherwise normal.                           A single 3 to 4 mm sessile polyp was found in the                            gastric fundus. Biopsies were taken with a cold                            forceps for histology.                           The exam of the stomach was otherwise normal.                           The duodenal bulb and second portion of the                            duodenum were normal. Complications:            No immediate complications. Estimated blood loss:                            Minimal. Estimated Blood Loss:     Estimated blood loss was minimal. Impression:               - Esophagogastric landmarks identified.                           - 5 cm hiatal hernia.                           - Z-line irregular, 34 cm from the incisors.                             Biopsied.                           - A single benign appering gastric polyp. Biopsied.                           - Normal duodenal bulb and second portion of the                            duodenum. Recommendation:           - Patient has a contact number available for  emergencies. The signs and symptoms of potential                            delayed complications were discussed with the                            patient. Return to normal activities tomorrow.                            Written discharge instructions were provided to the                            patient.                           - Resume previous diet.                           - Continue present medications.                           - Await pathology results. Remo Lipps P. Jaquelynn Wanamaker, MD 04/12/2018 11:01:02 AM This report has been signed electronically.

## 2018-04-12 NOTE — Patient Instructions (Signed)
Impression/Recommendations:  Hiatal hernia handout given to patient.  Resume present medications. Continue previous diet.  Await pathology results.  YOU HAD AN ENDOSCOPIC PROCEDURE TODAY AT [facility]:   Refer to the procedure report that was given to you for any specific questions about what was found during the examination.  If the procedure report does not answer your questions, please call your gastroenterologist to clarify.  If you requested that your care partner not be given the details of your procedure findings, then the procedure report has been included in a sealed envelope for you to review at your convenience later.  YOU SHOULD EXPECT: Some feelings of bloating in the abdomen. Passage of more gas than usual.  Walking can help get rid of the air that was put into your GI tract during the procedure and reduce the bloating. If you had a lower endoscopy (such as a colonoscopy or flexible sigmoidoscopy) you may notice spotting of blood in your stool or on the toilet paper. If you underwent a bowel prep for your procedure, you may not have a normal bowel movement for a few days.  Please Note:  You might notice some irritation and congestion in your nose or some drainage.  This is from the oxygen used during your procedure.  There is no need for concern and it should clear up in a day or so.  SYMPTOMS TO REPORT IMMEDIATELY:  Following upper endoscopy (EGD)  Vomiting of blood or coffee ground material  New chest pain or pain under the shoulder blades  Painful or persistently difficult swallowing  New shortness of breath  Fever of 100F or higher  Black, tarry-looking stools  For urgent or emergent issues, a gastroenterologist can be reached at any hour by calling [phone].   DIET:  We do recommend a small meal at first, but then you may proceed to your regular diet.  Drink plenty of fluids but you should avoid alcoholic beverages for 24 hours.  ACTIVITY:   You should plan to take it easy for the rest of today and you should NOT DRIVE or use heavy machinery until tomorrow (because of the sedation medicines used during the test).    FOLLOW UP: Our staff will call the number listed on your records the next business day following your procedure to check on you and address any questions or concerns that you may have regarding the information given to you following your procedure. If we do not reach you, we will leave a message.  However, if you are feeling well and you are not experiencing any problems, there is no need to return our call.  We will assume that you have returned to your regular daily activities without incident.  If any biopsies were taken you will be contacted by phone or by letter within the next 1-3 weeks.  Please call us at [phone] if you have not heard about the biopsies in 3 weeks.    SIGNATURES/CONFIDENTIALITY: You and/or your care partner have signed paperwork which will be entered into your electronic medical record.  These signatures attest to the fact that that the information above on your After Visit Summary has been reviewed and is understood.  Full responsibility of the confidentiality of this discharge information lies with you and/or your care-partner.

## 2018-04-12 NOTE — Progress Notes (Signed)
Called to room to assist during endoscopic procedure.  Patient ID and intended procedure confirmed with present staff. Received instructions for my participation in the procedure from the performing physician.  

## 2018-04-16 ENCOUNTER — Telehealth: Payer: Self-pay

## 2018-04-16 NOTE — Telephone Encounter (Signed)
Called back to advice office that he is doing great.

## 2018-04-17 ENCOUNTER — Encounter: Payer: Self-pay | Admitting: Gastroenterology

## 2018-04-19 ENCOUNTER — Encounter: Payer: Self-pay | Admitting: Podiatry

## 2018-04-19 ENCOUNTER — Ambulatory Visit (INDEPENDENT_AMBULATORY_CARE_PROVIDER_SITE_OTHER): Payer: Medicare HMO

## 2018-04-19 ENCOUNTER — Ambulatory Visit: Payer: Medicare HMO | Admitting: Podiatry

## 2018-04-19 VITALS — BP 120/76 | HR 69

## 2018-04-19 DIAGNOSIS — M2012 Hallux valgus (acquired), left foot: Secondary | ICD-10-CM

## 2018-04-19 DIAGNOSIS — M2011 Hallux valgus (acquired), right foot: Secondary | ICD-10-CM

## 2018-04-19 NOTE — Patient Instructions (Signed)

## 2018-04-19 NOTE — Progress Notes (Signed)
Subjective:   Patient ID: Henry [name], male   DOB: 72 y.o.   MRN: [id number]   HPI Patient presents with extreme pain on the medial side of his left first metatarsal is been going for the last 30 years and gradually getting worse with no motion of the joint and states he has no pain within the joint itself.  Patient states is getting more difficult to wear shoes he is tried wider shoes and other modalities and patient does not smoke and likes to be active   Review of Systems  All other systems reviewed and are negative.       Objective:  Physical Exam  Constitutional: He appears well-developed and well-nourished.  Cardiovascular: Intact distal pulses.  Pulmonary/Chest: Effort normal.  Musculoskeletal: Normal range of motion.  Neurological: He is alert.  Skin: Skin is warm.  Nursing note and vitals reviewed.   Neurovascular status intact muscle strength is adequate range of motion was within normal limits except for severe loss of motion big toe joint left with no motion of the joint noted.  There is large spur formation on the medial aspect of the first metatarsal head and it is red and painful when palpated.  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  I do think we could just remove the bump and not address the joint as it is not sore and it is completely fused.  I explained doing this and that some day may require plating or possible implantation as he is willing to go the more simple type procedure.  He will talk to his wife call schedule surgery and be seen back prior to go over in greater detail  X-ray indicates that there is severe arthritis left first MPJ with dorsal spurring of the  first metatarsal also on the medial side of the right foot showing minimal symptoms currently

## 2018-04-20 ENCOUNTER — Telehealth: Payer: Self-pay | Admitting: *Deleted

## 2018-04-20 NOTE — Telephone Encounter (Signed)
"  I got your message.  October 15 sounds great.  I'll go home and register now."  Okay, thank you.

## 2018-04-20 NOTE — Telephone Encounter (Signed)
I called Henry [name] and informed him he will need a consultation with Dr. Paulla Dolly prior to his surgery date to sign the consent forms.  I transferred him to [name] so he could schedule an appointment.

## 2018-04-20 NOTE — Telephone Encounter (Signed)
"  I just saw Dr. Ila Mcgill this morning.  He said to call you when trying to set up my foot surgery.  My wife looked at the schedule and said to see if Dr. Paulla Dolly had anything available on the week of October 14, that whole week.  If you could set me up during that week of October.  Give me a call back and see if that will work for him."

## 2018-04-20 NOTE — Telephone Encounter (Signed)
I left Henry [name] a message that I will schedule his surgery for October 15.  I asked him to call if he has any questions.  I also informed him that someone from the surgical center would call him the Friday or Monday prior to his surgery date and they will give you your arrival time.  I asked him to register with the surgical center, instructions are in the brochure that we gave him.

## 2018-05-03 DIAGNOSIS — R69 Illness, unspecified: Secondary | ICD-10-CM | POA: Diagnosis not present

## 2018-05-08 DIAGNOSIS — R69 Illness, unspecified: Secondary | ICD-10-CM | POA: Diagnosis not present

## 2018-06-22 DIAGNOSIS — Z6824 Body mass index (BMI) 24.0-24.9, adult: Secondary | ICD-10-CM | POA: Diagnosis not present

## 2018-06-22 DIAGNOSIS — M79642 Pain in left hand: Secondary | ICD-10-CM | POA: Diagnosis not present

## 2018-07-02 ENCOUNTER — Ambulatory Visit: Payer: Medicare HMO | Admitting: Podiatry

## 2018-07-02 ENCOUNTER — Encounter: Payer: Self-pay | Admitting: Podiatry

## 2018-07-02 DIAGNOSIS — M2011 Hallux valgus (acquired), right foot: Secondary | ICD-10-CM | POA: Diagnosis not present

## 2018-07-02 DIAGNOSIS — M2012 Hallux valgus (acquired), left foot: Secondary | ICD-10-CM

## 2018-07-02 NOTE — Patient Instructions (Signed)
Pre-Operative Instructions  Congratulations, you have decided to take an important step towards improving your quality of life.  You can be assured that the doctors and staff at [facility] will be with you every step of the way.  Here are some important things you should know:  1. Plan to be at the surgery center/hospital at least 1 (one) hour prior to your scheduled time, unless otherwise directed by the surgical center/hospital staff.  You must have a responsible adult accompany you, remain during the surgery and drive you home.  Make sure you have directions to the surgical center/hospital to ensure you arrive on time. 2. If you are having surgery at [facility] or [facility] hospitals, you will need a copy of your medical history and physical form from your family physician within one month prior to the date of surgery. We will give you a form for your primary physician to complete.  3. We make every effort to accommodate the date you request for surgery.  However, there are times where surgery dates or times have to be moved.  We will contact you as soon as possible if a change in schedule is required.   4. No aspirin/ibuprofen for one week before surgery.  If you are on aspirin, any non-steroidal anti-inflammatory medications (Mobic, Aleve, Ibuprofen) should not be taken seven (7) days prior to your surgery.  You make take Tylenol for pain prior to surgery.  5. Medications - If you are taking daily heart and blood pressure medications, seizure, reflux, allergy, asthma, anxiety, pain or diabetes medications, make sure you notify the surgery center/hospital before the day of surgery so they can tell you which medications you should take or avoid the day of surgery. 6. No food or drink after midnight the night before surgery unless directed otherwise by surgical center/hospital staff. 7. No alcoholic beverages 24-hours prior to surgery.  No smoking 24-hours prior or 24-hours after  surgery. 8. Wear loose pants or shorts. They should be loose enough to fit over bandages, boots, and casts. 9. Don't wear slip-on shoes. Sneakers are preferred. 10. Bring your boot with you to the surgery center/hospital.  Also bring crutches or a walker if your physician has prescribed it for you.  If you do not have this equipment, it will be provided for you after surgery. 11. If you have not been contacted by the surgery center/hospital by the day before your surgery, call to confirm the date and time of your surgery. 12. Leave-time from work may vary depending on the type of surgery you have.  Appropriate arrangements should be made prior to surgery with your employer. 13. Prescriptions will be provided immediately following surgery by your doctor.  Fill these as soon as possible after surgery and take the medication as directed. Pain medications will not be refilled on weekends and must be approved by the doctor. 14. Remove nail polish on the operative foot and avoid getting pedicures prior to surgery. 15. Wash the night before surgery.  The night before surgery wash the foot and leg well with water and the antibacterial soap provided. Be sure to pay special attention to beneath the toenails and in between the toes.  Wash for at least three (3) minutes. Rinse thoroughly with water and dry well with a towel.  Perform this wash unless told not to do so by your physician.  Enclosed: 1 Ice pack (please put in freezer the night before surgery)   1 Hibiclens skin cleaner     Pre-op instructions  If you have any questions regarding the instructions, please do not hesitate to call our office.  [place]: [address], [place], [place] [address] -- [phone]  [place]: [address] [place], [place] [address] -- [phone]  [place]: [address]  [place], [place] [address] -- [phone]  [place]: [address], [address], [place], [place] [address] -- [phone]  Website:  [email or web address] 

## 2018-07-03 NOTE — Progress Notes (Signed)
Subjective:   Patient ID: Henry [name], male   DOB: 72 y.o.   MRN: [id number]   HPI Patient presents stating he is ready to get this huge painful bump taken off understanding that were not addressed the structure of the joint   ROS      Objective:  Physical Exam  Neurovascular status intact with patient noted to have a large hyperostosis of the medial dorsal aspect of the first metatarsal with severe loss of motion of the first MPJ with pain that is mostly associated with structural bump deformity     Assessment:  Hallux limitus deformity with rigidus component with large spurring which is becoming painful with shoe gear     Plan:  H&P condition reviewed and I recommended removal of this eminence.  I discussed the procedure and reviewed removal and explained that it will not change the function of the joint and the only way to do that would be to either completely fuse the joint or put an implant in order to hold off on doing that even though it may be necessary some day.  I allowed him to read consent form going over alternative treatments complications he is willing to accept risk and at this time signed consent form after extensive review.  Patient understands total recovery can take upwards of 6 months and is encouraged to call with questions prior to procedure

## 2018-07-09 DIAGNOSIS — Z125 Encounter for screening for malignant neoplasm of prostate: Secondary | ICD-10-CM | POA: Diagnosis not present

## 2018-07-09 DIAGNOSIS — K219 Gastro-esophageal reflux disease without esophagitis: Secondary | ICD-10-CM | POA: Diagnosis not present

## 2018-07-09 DIAGNOSIS — E559 Vitamin D deficiency, unspecified: Secondary | ICD-10-CM | POA: Diagnosis not present

## 2018-07-09 DIAGNOSIS — E7800 Pure hypercholesterolemia, unspecified: Secondary | ICD-10-CM | POA: Diagnosis not present

## 2018-07-10 ENCOUNTER — Encounter: Payer: Self-pay | Admitting: Podiatry

## 2018-07-10 DIAGNOSIS — M2012 Hallux valgus (acquired), left foot: Secondary | ICD-10-CM | POA: Diagnosis not present

## 2018-07-10 DIAGNOSIS — M21612 Bunion of left foot: Secondary | ICD-10-CM | POA: Diagnosis not present

## 2018-07-10 DIAGNOSIS — K219 Gastro-esophageal reflux disease without esophagitis: Secondary | ICD-10-CM | POA: Diagnosis not present

## 2018-07-13 DIAGNOSIS — Z0000 Encounter for general adult medical examination without abnormal findings: Secondary | ICD-10-CM | POA: Diagnosis not present

## 2018-07-13 DIAGNOSIS — N400 Enlarged prostate without lower urinary tract symptoms: Secondary | ICD-10-CM | POA: Diagnosis not present

## 2018-07-13 DIAGNOSIS — Z789 Other specified health status: Secondary | ICD-10-CM | POA: Diagnosis not present

## 2018-07-13 DIAGNOSIS — Z1212 Encounter for screening for malignant neoplasm of rectum: Secondary | ICD-10-CM | POA: Diagnosis not present

## 2018-07-13 DIAGNOSIS — E559 Vitamin D deficiency, unspecified: Secondary | ICD-10-CM | POA: Diagnosis not present

## 2018-07-13 DIAGNOSIS — N529 Male erectile dysfunction, unspecified: Secondary | ICD-10-CM | POA: Diagnosis not present

## 2018-07-13 DIAGNOSIS — G4700 Insomnia, unspecified: Secondary | ICD-10-CM | POA: Diagnosis not present

## 2018-07-13 DIAGNOSIS — H9313 Tinnitus, bilateral: Secondary | ICD-10-CM | POA: Diagnosis not present

## 2018-07-13 DIAGNOSIS — K219 Gastro-esophageal reflux disease without esophagitis: Secondary | ICD-10-CM | POA: Diagnosis not present

## 2018-07-13 DIAGNOSIS — E7800 Pure hypercholesterolemia, unspecified: Secondary | ICD-10-CM | POA: Diagnosis not present

## 2018-07-13 DIAGNOSIS — Z23 Encounter for immunization: Secondary | ICD-10-CM | POA: Diagnosis not present

## 2018-07-18 ENCOUNTER — Ambulatory Visit (INDEPENDENT_AMBULATORY_CARE_PROVIDER_SITE_OTHER): Payer: Medicare HMO | Admitting: Podiatry

## 2018-07-18 ENCOUNTER — Ambulatory Visit (INDEPENDENT_AMBULATORY_CARE_PROVIDER_SITE_OTHER): Payer: Medicare HMO

## 2018-07-18 ENCOUNTER — Encounter: Payer: Self-pay | Admitting: Podiatry

## 2018-07-18 VITALS — Temp 97.7°F

## 2018-07-18 DIAGNOSIS — M2012 Hallux valgus (acquired), left foot: Secondary | ICD-10-CM

## 2018-07-18 DIAGNOSIS — M2011 Hallux valgus (acquired), right foot: Secondary | ICD-10-CM

## 2018-07-18 NOTE — Progress Notes (Signed)
Subjective:   Patient ID: Henry [name], male   DOB: 72 y.o.   MRN: [id number]   HPI Patient presents doing very well with minimal discomfort and able to wear shoe gear without pain   ROS      Objective:  Physical Exam  Neurovascular status intact negative Homans sign noted wound edges well coapted first metatarsal left with significant reduction of the bone enlargement and good range of motion with no crepitus     Assessment:  Doing well post aggressive McBride bunionectomy with bone resection     Plan:  H&P condition reviewed and recommended continuation of immobilization for the next few weeks gradual increase in activity and dispensed Ace wrap and ankle compression stocking.  Reappoint 4 weeks or earlier if needed  X-ray indicates that there is satisfactory resection of bone with no indications of pathology

## 2018-08-15 ENCOUNTER — Encounter: Payer: Self-pay | Admitting: Podiatry

## 2018-08-15 ENCOUNTER — Ambulatory Visit (INDEPENDENT_AMBULATORY_CARE_PROVIDER_SITE_OTHER): Payer: Medicare HMO

## 2018-08-15 ENCOUNTER — Ambulatory Visit: Payer: Medicare HMO | Admitting: Podiatry

## 2018-08-15 DIAGNOSIS — M2011 Hallux valgus (acquired), right foot: Secondary | ICD-10-CM

## 2018-08-15 DIAGNOSIS — M2012 Hallux valgus (acquired), left foot: Secondary | ICD-10-CM | POA: Diagnosis not present

## 2018-08-15 NOTE — Progress Notes (Signed)
Subjective:   Patient ID: Henry [name], male   DOB: 72 y.o.   MRN: [id number]   HPI Patient states she is doing very well with minimal discomfort and is very pleased with the surgery   ROS      Objective:  Physical Exam  Neurovascular status intact with patient's left foot healing very well wound edges well coapted range of motion adequate with no crepitus of the joint     Assessment:  Doing well post McBride bunionectomy left     Plan:  Advised on increase activity levels anti-inflammatories wider shoe and that swelling will persist but should be better the next few months and reappoint as symptoms indicate  X-ray indicates satisfactory resection of bone both dorsal medial lateral aspect first metatarsal left

## 2018-08-30 DIAGNOSIS — R69 Illness, unspecified: Secondary | ICD-10-CM | POA: Diagnosis not present

## 2018-09-03 DIAGNOSIS — R69 Illness, unspecified: Secondary | ICD-10-CM | POA: Diagnosis not present

## 2018-09-11 DIAGNOSIS — R69 Illness, unspecified: Secondary | ICD-10-CM | POA: Diagnosis not present

## 2018-09-13 DIAGNOSIS — R69 Illness, unspecified: Secondary | ICD-10-CM | POA: Diagnosis not present

## 2018-10-03 DIAGNOSIS — R69 Illness, unspecified: Secondary | ICD-10-CM | POA: Diagnosis not present

## 2018-10-16 DIAGNOSIS — R69 Illness, unspecified: Secondary | ICD-10-CM | POA: Diagnosis not present

## 2018-10-31 DIAGNOSIS — R69 Illness, unspecified: Secondary | ICD-10-CM | POA: Diagnosis not present

## 2019-04-17 DIAGNOSIS — H2513 Age-related nuclear cataract, bilateral: Secondary | ICD-10-CM | POA: Diagnosis not present

## 2019-07-10 DIAGNOSIS — R69 Illness, unspecified: Secondary | ICD-10-CM | POA: Diagnosis not present

## 2019-07-17 DIAGNOSIS — Z125 Encounter for screening for malignant neoplasm of prostate: Secondary | ICD-10-CM | POA: Diagnosis not present

## 2019-07-17 DIAGNOSIS — E7800 Pure hypercholesterolemia, unspecified: Secondary | ICD-10-CM | POA: Diagnosis not present

## 2019-07-17 DIAGNOSIS — Z1159 Encounter for screening for other viral diseases: Secondary | ICD-10-CM | POA: Diagnosis not present

## 2019-07-22 ENCOUNTER — Other Ambulatory Visit: Payer: Self-pay | Admitting: *Deleted

## 2019-07-22 DIAGNOSIS — J309 Allergic rhinitis, unspecified: Secondary | ICD-10-CM | POA: Diagnosis not present

## 2019-07-22 DIAGNOSIS — Z0000 Encounter for general adult medical examination without abnormal findings: Secondary | ICD-10-CM | POA: Diagnosis not present

## 2019-07-22 DIAGNOSIS — K219 Gastro-esophageal reflux disease without esophagitis: Secondary | ICD-10-CM | POA: Diagnosis not present

## 2019-07-22 DIAGNOSIS — E7800 Pure hypercholesterolemia, unspecified: Secondary | ICD-10-CM | POA: Diagnosis not present

## 2019-07-22 DIAGNOSIS — Z20822 Contact with and (suspected) exposure to covid-19: Secondary | ICD-10-CM

## 2019-07-22 DIAGNOSIS — H903 Sensorineural hearing loss, bilateral: Secondary | ICD-10-CM | POA: Diagnosis not present

## 2019-07-22 DIAGNOSIS — Z86010 Personal history of colonic polyps: Secondary | ICD-10-CM | POA: Diagnosis not present

## 2019-07-22 DIAGNOSIS — Z789 Other specified health status: Secondary | ICD-10-CM | POA: Diagnosis not present

## 2019-07-22 DIAGNOSIS — K22719 Barrett's esophagus with dysplasia, unspecified: Secondary | ICD-10-CM | POA: Diagnosis not present

## 2019-07-22 DIAGNOSIS — J069 Acute upper respiratory infection, unspecified: Secondary | ICD-10-CM | POA: Diagnosis not present

## 2019-07-22 DIAGNOSIS — N400 Enlarged prostate without lower urinary tract symptoms: Secondary | ICD-10-CM | POA: Diagnosis not present

## 2019-07-24 DIAGNOSIS — R69 Illness, unspecified: Secondary | ICD-10-CM | POA: Diagnosis not present

## 2019-07-24 LAB — NOVEL CORONAVIRUS, NAA: SARS-CoV-2, NAA: NOT DETECTED

## 2019-08-31 ENCOUNTER — Other Ambulatory Visit: Payer: Self-pay

## 2019-08-31 ENCOUNTER — Emergency Department (HOSPITAL_BASED_OUTPATIENT_CLINIC_OR_DEPARTMENT_OTHER)
Admission: EM | Admit: 2019-08-31 | Discharge: 2019-08-31 | Disposition: A | Discharge status: Home / Self Care | Payer: Medicare HMO | Attending: Emergency Medicine | Admitting: Emergency Medicine

## 2019-08-31 ENCOUNTER — Encounter (HOSPITAL_BASED_OUTPATIENT_CLINIC_OR_DEPARTMENT_OTHER): Payer: Self-pay | Admitting: Emergency Medicine

## 2019-08-31 DIAGNOSIS — R42 Dizziness and giddiness: Secondary | ICD-10-CM | POA: Insufficient documentation

## 2019-08-31 DIAGNOSIS — Z79899 Other long term (current) drug therapy: Secondary | ICD-10-CM | POA: Diagnosis not present

## 2019-08-31 DIAGNOSIS — F17290 Nicotine dependence, other tobacco product, uncomplicated: Secondary | ICD-10-CM | POA: Insufficient documentation

## 2019-08-31 DIAGNOSIS — R69 Illness, unspecified: Secondary | ICD-10-CM | POA: Diagnosis not present

## 2019-08-31 LAB — CBC WITH DIFFERENTIAL/PLATELET
Abs Immature Granulocytes: 0.03 10*3/uL (ref 0.00–0.07)
Basophils Absolute: 0.1 10*3/uL (ref 0.0–0.1)
Basophils Relative: 1 %
Eosinophils Absolute: 0.2 10*3/uL (ref 0.0–0.5)
Eosinophils Relative: 3 %
HCT: 43.1 % (ref 39.0–52.0)
Hemoglobin: 13.9 g/dL (ref 13.0–17.0)
Immature Granulocytes: 0 %
Lymphocytes Relative: 14 %
Lymphs Abs: 1 10*3/uL (ref 0.7–4.0)
MCH: 30.2 pg (ref 26.0–34.0)
MCHC: 32.3 g/dL (ref 30.0–36.0)
MCV: 93.7 fL (ref 80.0–100.0)
Monocytes Absolute: 0.4 10*3/uL (ref 0.1–1.0)
Monocytes Relative: 6 %
Neutro Abs: 5.5 10*3/uL (ref 1.7–7.7)
Neutrophils Relative %: 76 %
Platelets: 240 10*3/uL (ref 150–400)
RBC: 4.6 MIL/uL (ref 4.22–5.81)
RDW: 13.6 % (ref 11.5–15.5)
WBC: 7.3 10*3/uL (ref 4.0–10.5)
nRBC: 0 % (ref 0.0–0.2)

## 2019-08-31 LAB — BASIC METABOLIC PANEL
Anion gap: 6 (ref 5–15)
BUN: 20 mg/dL (ref 8–23)
CO2: 27 mmol/L (ref 22–32)
Calcium: 9.6 mg/dL (ref 8.9–10.3)
Chloride: 106 mmol/L (ref 98–111)
Creatinine, Ser: 1 mg/dL (ref 0.61–1.24)
GFR calc Af Amer: >60 mL/min (ref >60)
GFR calc non Af Amer: >60 mL/min (ref >60)
Glucose, Bld: 115 mg/dL — ABNORMAL HIGH (ref 70–99)
Potassium: 4.2 mmol/L (ref 3.5–5.1)
Sodium: 139 mmol/L (ref 135–145)

## 2019-08-31 LAB — TROPONIN I (HIGH SENSITIVITY): Troponin I (High Sensitivity): 4 ng/L (ref <18)

## 2019-08-31 MED ORDER — ONDANSETRON 4 MG PO TBDP: 4.0000 mg | ORAL_TABLET | Freq: Three times a day (TID) | ORAL | 0 refills | Status: DC | PRN

## 2019-08-31 MED ORDER — ONDANSETRON HCL 4 MG/2ML IJ SOLN
4.0000 mg | Freq: Once | INTRAMUSCULAR | Status: AC
  Administered 2019-08-31: 4 mg via INTRAVENOUS
  Filled 2019-08-31: qty 2

## 2019-08-31 MED ORDER — MECLIZINE HCL 25 MG PO TABS
25.0000 mg | ORAL_TABLET | Freq: Once | ORAL | Status: AC
  Administered 2019-08-31: 25 mg via ORAL
  Filled 2019-08-31: qty 1

## 2019-08-31 MED ORDER — SODIUM CHLORIDE 0.9 % IV BOLUS
500.0000 mL | Freq: Once | INTRAVENOUS | Status: AC
  Administered 2019-08-31: 500 mL via INTRAVENOUS

## 2019-08-31 MED ORDER — MECLIZINE HCL 25 MG PO TABS: 25.0000 mg | ORAL_TABLET | Freq: Three times a day (TID) | ORAL | 0 refills | Status: DC | PRN

## 2019-08-31 NOTE — Discharge Instructions (Signed)
Keep yourself hydrated at home.  I prescribed you medications in case you have another episode of vertigo.  You can follow-up with your primary care provider if you are having continuous episodes, as you may need to see be seen by a specialist.  I suspect your symptoms today was most likely caused by a condition inside your inner ear.  However, we discussed some of the signs or symptoms of a possible stroke, and what to look out for.  This includes any changes from your usual sense of vertigo, severe and intractable vomiting, more gradual onset of your symptoms, or severe difficulties with walking or balance.  If you have these or any other unusual concerns, you should return to the emergency department.

## 2019-08-31 NOTE — ED Provider Notes (Signed)
[facility] EMERGENCY DEPARTMENT Provider Note   CSN: [id number] Arrival date & time: 08/31/19  [id number]     History   Chief Complaint Vertigo  HPI Henry [name] is a 73 y.o. male with a history of vertigo, tinnitus, hearing loss with bilateral hearing aids, presenting to the ED with vertigo.  Patient reports he woke up with vertigo this morning around 4:56 AM.  He describes severe room spinning sensation and nausea and an episode of vomiting prior to arrival.  He states he has had similar episodes of vertigo that occur spontaneously in the past year, with 2 prior episodes, usually lasting several minutes and then abating.  This the longest episode he has ever had.  He otherwise feels that they are similar to the prior episodes.  He denies any fevers, chills, cough congestion or recent URI symptoms.  He denies any chest pain or palpitations or shortness of breath.  He denies headache.  He denies vision changes.  He denies any history of strokes.  He denies history of hypertension or diabetes or smoking.  He states he has borderline high cholesterol.    NKDA  HPI  Past Medical History:  Diagnosis Date  . Adenomatous colon polyp   . Barrett's esophagus   . Diverticulosis   . GERD (gastroesophageal reflux disease)   . Internal hemorrhoids   . Kidney stone    2 stones over 15 years ago  . Tinnitus, bilateral     uses hearing aid for noises/ no hearing loss    Patient Active Problem List   Diagnosis Date Noted  . Hoarseness or changing voice 07/24/2013  . Barrett's esophagus 11/10/2011  . Personal history of colonic polyps 11/10/2011    Past Surgical History:  Procedure Laterality Date  . ABDOMINAL HERNIA REPAIR  2008  . COLONOSCOPY    . LUMBAR LAMINECTOMY  1986  . UPPER GI ENDOSCOPY          Home Medications    Prior to Admission medications   Medication Sig Start Date End Date Taking? Authorizing Provider  acetaminophen (TYLENOL) 325 MG tablet Take 650 mg  by mouth. Take 2 pills daily as needed    [provider]  Cholecalciferol 5000 UNITS capsule Take 5,000 Units by mouth daily.     [provider]  diclofenac sodium (VOLTAREN) 1 % GEL USE AS DIRECTED 4 TIMES DAILY FOR 30 DAYS 06/25/18   [provider]  famciclovir (FAMVIR) 500 MG tablet TAKE 1 2 (ONE HALF) TABLET BY MOUTH THREE TIMES DAILY FOR 5 DAYS AS NEEDED FOR FLARE UP 03/05/18   [provider]  Glucosamine Sulfate 1000 MG TABS Take 1 tablet by mouth daily.    [provider]  HYDROcodone-acetaminophen [facility]) 10-325 MG tablet  07/10/18   [provider]  meclizine (ANTIVERT) 25 MG tablet Take 1 tablet (25 mg total) by mouth 3 (three) times daily as needed for up to 25 doses for dizziness. 08/31/19   Wyvonnia Dusky, MD  omeprazole (PRILOSEC) 20 MG capsule Take 20 mg by mouth daily.    [provider]  ondansetron (ZOFRAN ODT) 4 MG disintegrating tablet Take 1 tablet (4 mg total) by mouth every 8 (eight) hours as needed for up to 15 doses for nausea or vomiting. 08/31/19   Wyvonnia Dusky, MD  Phytosterol-Fish Oil-EPA-DHA 500 MG CAPS Take 4 capsules by mouth daily.    [provider]  tadalafil (CIALIS) 20 MG tablet Take 20 mg by  mouth as needed.    [provider]  terbinafine (LAMISIL) 250 MG tablet TAKE 1 TABLET BY MOUTH ONCE DAILY FOR 90 DAYS 02/21/18   [provider]    Family History Family History  Problem Relation Age of Onset  . Alzheimer's disease Mother   . Alcohol abuse Father   . Brain cancer Paternal Grandfather   . Colon cancer Neg Hx   . Esophageal cancer Neg Hx   . Stomach cancer Neg Hx     Social History Social History   Tobacco Use  . Smoking status: Current Some Day Smoker    Types: Cigars  . Smokeless tobacco: Never Used  . Tobacco comment: occasional  Substance Use Topics  . Alcohol use: No    Alcohol/week: 0.0 standard drinks  . Drug use: No     Allergies    Patient has no known allergies.   Review of Systems Review of Systems  Constitutional: Negative for chills and fever.  HENT: Negative for congestion and sore throat.   Eyes: Negative for photophobia and visual disturbance.  Respiratory: Negative for cough and shortness of breath.   Cardiovascular: Negative for chest pain and palpitations.  Gastrointestinal: Positive for nausea and vomiting. Negative for abdominal pain.  Musculoskeletal: Negative for neck pain and neck stiffness.  Neurological: Positive for dizziness and light-headedness. Negative for seizures, syncope, facial asymmetry, speech difficulty, weakness, numbness and headaches.  Psychiatric/Behavioral: Negative for agitation and confusion.  All other systems reviewed and are negative.    Physical Exam Updated Vital Signs BP 135/78 (BP Location: Right Arm)   Pulse 66   Temp (!) 97.4 F (36.3 C) (Oral)   Resp 18   Ht 5\' 6"  (1.676 m)   Wt 70.3 kg   SpO2 100%   BMI 25.02 kg/m   Physical Exam Vitals signs and nursing note reviewed.  Constitutional:      Appearance: He is well-developed.  HENT:     Head: Normocephalic and atraumatic.  Eyes:     Conjunctiva/sclera: Conjunctivae normal.  Neck:     Musculoskeletal: Neck supple.  Cardiovascular:     Rate and Rhythm: Normal rate and regular rhythm.     Heart sounds: No murmur.  Pulmonary:     Effort: Pulmonary effort is normal. No respiratory distress.     Breath sounds: Normal breath sounds.  Abdominal:     Palpations: Abdomen is soft.     Tenderness: There is no abdominal tenderness.  Skin:    General: Skin is warm and dry.  Neurological:     Mental Status: He is alert.     GCS: GCS eye subscore is 4. GCS verbal subscore is 5. GCS motor subscore is 6.     Cranial Nerves: Cranial nerves are intact. No dysarthria or facial asymmetry.     Sensory: Sensation is intact.     Motor: Motor function is intact.     Coordination: Coordination is intact. Romberg sign  negative. Finger-Nose-Finger Test normal.     Comments: Hints exam absent skew deviation, directional fixed horizontal nystagmus, normal head impulse test w/ corrective saccade Unsteady gait  Psychiatric:        Mood and Affect: Mood normal.        Behavior: Behavior normal.      ED Treatments / Results  Labs (all labs ordered are listed, but only abnormal results are displayed) Labs Reviewed  BASIC METABOLIC PANEL - Abnormal; Notable for the following components:      Result  Value   Glucose, Bld 115 (*)    All other components within normal limits  CBC WITH DIFFERENTIAL/PLATELET  TROPONIN I (HIGH SENSITIVITY)    EKG EKG Interpretation  Date/Time:  Saturday August 31 2019 08:00:22 EST Ventricular Rate:  51 PR Interval:    QRS Duration: 100 QT Interval:  455 QTC Calculation: 419 R Axis:   74 Text Interpretation: Sinus rhythm No STEMI Confirmed by Octaviano Glow [phone]) on 08/31/2019 8:09:12 AM   Radiology No results found.  Procedures Procedures (including critical care time)  Medications Ordered in ED Medications  ondansetron (ZOFRAN) injection 4 mg (4 mg Intravenous Given 08/31/19 0917)  meclizine (ANTIVERT) tablet 25 mg (25 mg Oral Given 08/31/19 0919)  sodium chloride 0.9 % bolus 500 mL (0 mLs Intravenous Stopped 08/31/19 1022)     Initial Impression / Assessment and Plan / ED Course  I have reviewed the triage vital signs and the nursing notes.  Pertinent labs & imaging results that were available during my care of the patient were reviewed by me and considered in my medical decision making (see chart for details).  73 yo male presenting with vertigo acute onset since this morning.  It is positional and worse with movement, improved at rest.  HINTS exam suggestive of peripheral process, and given his prior history of vertigo this year and his known auditory/aural deficits (tinnitus, hearing loss), I suspect this is a peripheral condition.  Lower suspicion for  stroke at this time.  He has no known risk factors for stroke aside from his age  Plan to check ECG, trop for atypical ACS rule out Check electrolytes, CBC as well  Very low suspicion for meningitis or cranial infection at this time  Clinical Course as of Aug 30 1524  Sat Aug 31, 2019  0944 Troponin I (High Sensitivity): 4 [MT]  1022 Patient is feeling much better after medications.  Will discharge home with Antivert prescription.   [MT]    Clinical Course User Index [MT] Janelle Culton, Carola Rhine, MD   This note was dictated using dragon dictation software.  Please be aware that there may be minor translation errors as a result of this oral dictation   Final Clinical Impressions(s) / ED Diagnoses   Final diagnoses:  Vertigo    ED Discharge Orders         Ordered    meclizine (ANTIVERT) 25 MG tablet  3 times daily PRN     08/31/19 1031    ondansetron (ZOFRAN ODT) 4 MG disintegrating tablet  Every 8 hours PRN     08/31/19 1031           Wyvonnia Dusky, MD 08/31/19 1525

## 2019-08-31 NOTE — ED Triage Notes (Addendum)
States," I woke up at 5;45 and I I did not feel quite right" Dizzy, nauseated, had a prior episode x 6 months ago, resolved and this is worse today as far as dizziness . Gait slightly unsteady, a couple of times used arm rail when walking back to room

## 2019-10-22 ENCOUNTER — Ambulatory Visit: Payer: Medicare HMO

## 2019-10-30 DIAGNOSIS — L304 Erythema intertrigo: Secondary | ICD-10-CM | POA: Diagnosis not present

## 2019-10-31 ENCOUNTER — Ambulatory Visit: Payer: Medicare HMO | Attending: Internal Medicine

## 2019-10-31 DIAGNOSIS — Z23 Encounter for immunization: Secondary | ICD-10-CM | POA: Insufficient documentation

## 2019-10-31 NOTE — Progress Notes (Signed)
   Covid-19 Vaccination Clinic  Name:  Henry [name]    MRN: [id number] DOB: 08-Mar-1946  10/31/2019  [name] was observed post Covid-19 immunization for 15 minutes without incidence. He was provided with Vaccine Information Sheet and instruction to access the V-Safe system.   [name] was instructed to call 911 with any severe reactions post vaccine: Marland Kitchen Difficulty breathing  . Swelling of your face and throat  . A fast heartbeat  . A bad rash all over your body  . Dizziness and weakness    Immunizations Administered    Name Date Dose VIS Date Route   Pfizer COVID-19 Vaccine 10/31/2019  9:17 AM 0.3 mL 09/06/2019 Intramuscular   Manufacturer: [place]   Lot: [id number]   [place]: [id number]

## 2019-11-11 DIAGNOSIS — L280 Lichen simplex chronicus: Secondary | ICD-10-CM | POA: Diagnosis not present

## 2019-11-11 DIAGNOSIS — L308 Other specified dermatitis: Secondary | ICD-10-CM | POA: Diagnosis not present

## 2019-11-12 ENCOUNTER — Ambulatory Visit: Payer: Medicare HMO

## 2019-11-25 ENCOUNTER — Ambulatory Visit: Payer: Medicare HMO | Attending: Internal Medicine

## 2019-11-25 DIAGNOSIS — Z23 Encounter for immunization: Secondary | ICD-10-CM | POA: Insufficient documentation

## 2019-11-25 NOTE — Progress Notes (Signed)
   Covid-19 Vaccination Clinic  Name:  Henry [name]    MRN: [id number] DOB: 09-Dec-1945  11/25/2019  [name] was observed post Covid-19 immunization for 15 minutes without incidence. He was provided with Vaccine Information Sheet and instruction to access the V-Safe system.   [name] was instructed to call 911 with any severe reactions post vaccine: Marland Kitchen Difficulty breathing  . Swelling of your face and throat  . A fast heartbeat  . A bad rash all over your body  . Dizziness and weakness    Immunizations Administered    Name Date Dose VIS Date Route   Pfizer COVID-19 Vaccine 11/25/2019  1:51 PM 0.3 mL 09/06/2019 Intramuscular   Manufacturer: [place]   Lot: [id number]   [place]: [id number]

## 2020-01-15 DIAGNOSIS — R69 Illness, unspecified: Secondary | ICD-10-CM | POA: Diagnosis not present

## 2020-02-03 DIAGNOSIS — R69 Illness, unspecified: Secondary | ICD-10-CM | POA: Diagnosis not present

## 2020-02-12 DIAGNOSIS — B352 Tinea manuum: Secondary | ICD-10-CM | POA: Diagnosis not present

## 2020-02-12 DIAGNOSIS — B351 Tinea unguium: Secondary | ICD-10-CM | POA: Diagnosis not present

## 2020-04-30 DIAGNOSIS — R69 Illness, unspecified: Secondary | ICD-10-CM | POA: Diagnosis not present

## 2020-05-21 DIAGNOSIS — R69 Illness, unspecified: Secondary | ICD-10-CM | POA: Diagnosis not present

## 2020-05-25 DIAGNOSIS — R69 Illness, unspecified: Secondary | ICD-10-CM | POA: Diagnosis not present

## 2020-06-17 ENCOUNTER — Other Ambulatory Visit: Payer: Self-pay

## 2020-06-17 ENCOUNTER — Ambulatory Visit: Payer: Medicare HMO | Admitting: Family Medicine

## 2020-06-17 DIAGNOSIS — M7662 Achilles tendinitis, left leg: Secondary | ICD-10-CM | POA: Diagnosis not present

## 2020-06-17 MED ORDER — MELOXICAM 15 MG PO TABS: ORAL_TABLET | ORAL | 0 refills | Status: DC

## 2020-06-17 NOTE — Patient Instructions (Signed)
It was great to meet you today! Thank you for letting me participate in your care!  Today, we discussed your left heel pain which is due to achilles tendonitis. This will improve with wearing the heel lift, taking the medication, and doing the at home exercises. I would avoid strenuous activity such as running or explosive movements for the next 2 weeks. If you feel better, gradually reintroduce those activities as pain allows.  If you are completely better in 4 weeks you do not need to come back but if your pain continues please return to clinic.  Be well, Harolyn Rutherford, DO PGY-4, Sports Medicine Fellow [place]

## 2020-06-17 NOTE — Progress Notes (Addendum)
SUBJECTIVE:   CHIEF COMPLAINT / HPI:   Left heel pain Mr. Henry [name] is a new patient to this practice who is a very pleasant 74 year old male who presents today with about 2 months of left heel pain.  He states it began 2 months ago while he was running in place trying to warm up before run and he had some pain.  He did not run that day and took it easy since went on vacation tape it and states that it improved for several weeks.  However after that he states that periodically during activity or right after he would begin having left heel pain but not every time.  He was hopeful that this would self resolve with conservative efforts such as rest and over-the-counter medications however he did not.  He states just yesterday he went for a walk with his wife and it began hurting so bad he was unsure if he would be able to make it home.  He has had no previous injury to his left ankle, no trauma and this has not caused him to fall.  The pain is localized to the left heel area and does not radiate up the calf or down into the foot.  He gets worse with activity and tends to get better with rest.  PERTINENT  PMH / PSH: History of Barrett's esophagus, history of polyps in the colon  OBJECTIVE:   BP 132/78   Ht 5\' 6"  (1.676 m)   Wt 155 lb (70.3 kg)   BMI 25.02 kg/m   [facility] Adult Exercise 06/17/2020  Frequency of aerobic exercise (# of days/week) 5  Average time in minutes 30  Frequency of strengthening activities (# of days/week) 5   Ankle/Foot, Left: No visible erythema, swelling, ecchymosis, or bony deformity. No obvious defect of the achilles tendon. Tenderness to palpation at about 5cm proximal to the insertion of the achilles tendon. Range of motion is full in all directions. Strength is 5/5 in all directions. Gross sensation intact. +2 dorsalis pedis pulse.  Able to walk 4 steps.  Special Tests:   - Thompson Squeeze test: NEG   - Anterior Drawer test: NEG   - Talar Tilt  test: NEG  Imaging Ultrasound of the left Achilles tendon reveals no hypoechoic changes, no fiber disruption at the insertion of the Achilles tendon on the calcaneus.  No obvious calcaneal spur seen on ultrasound.  Thickness of the Achilles tendon approximately 5 cm proximal to the insertion with osteophyte development within the Achilles tendon.  Diameter of the Achilles tendon at this point was measured to be 0.91 cm. Interpretation: Achilles tendinitis midsubstance  ASSESSMENT/PLAN:   Tendonitis, Achilles, left Given history, clinical presentation, exam, and ultrasound findings today he has Achilles tendinitis midsubstance just proximal to the insertion.  No evidence of tear or partial tearing as this is more most likely an acute on chronic condition given that he had osteophyte development within the tendon. -Patient given home exercises to do along with meloxicam 15 mg to take once daily with food for the next 2 weeks scheduled and then as needed for pain -Encourage patient to ice after walks and avoid explosive activity and running for the next 2 weeks -Patient also given heel lift to wear in his shoe at all times and to avoid going barefoot around the house -Follow-up in 4 weeks     Nuala Alpha, DO PGY-4, Sports Medicine Fellow [place]  Addendum:  Patient seen  in the office by fellow.  His history, exam, plan of care were precepted with me.  Henry Lemon MD Henry [name]

## 2020-06-17 NOTE — Assessment & Plan Note (Signed)
Given history, clinical presentation, exam, and ultrasound findings today he has Achilles tendinitis midsubstance just proximal to the insertion.  No evidence of tear or partial tearing as this is more most likely an acute on chronic condition given that he had osteophyte development within the tendon. -Patient given home exercises to do along with meloxicam 15 mg to take once daily with food for the next 2 weeks scheduled and then as needed for pain -Encourage patient to ice after walks and avoid explosive activity and running for the next 2 weeks -Patient also given heel lift to wear in his shoe at all times and to avoid going barefoot around the house -Follow-up in 4 weeks

## 2020-06-18 ENCOUNTER — Encounter: Payer: Self-pay | Admitting: Family Medicine

## 2020-06-29 DIAGNOSIS — R69 Illness, unspecified: Secondary | ICD-10-CM | POA: Diagnosis not present

## 2020-07-24 ENCOUNTER — Other Ambulatory Visit: Payer: Self-pay | Admitting: Family Medicine

## 2020-07-27 ENCOUNTER — Other Ambulatory Visit: Payer: Self-pay | Admitting: *Deleted

## 2020-07-27 MED ORDER — MELOXICAM 15 MG PO TABS: ORAL_TABLET | ORAL | 0 refills | Status: DC

## 2020-07-28 DIAGNOSIS — R69 Illness, unspecified: Secondary | ICD-10-CM | POA: Diagnosis not present

## 2020-08-05 ENCOUNTER — Ambulatory Visit: Payer: Medicare HMO | Admitting: Family Medicine

## 2020-08-05 ENCOUNTER — Other Ambulatory Visit: Payer: Self-pay

## 2020-08-05 VITALS — BP 120/78 | Ht 66.0 in | Wt 155.0 lb

## 2020-08-05 DIAGNOSIS — M7662 Achilles tendinitis, left leg: Secondary | ICD-10-CM | POA: Diagnosis not present

## 2020-08-05 MED ORDER — NITROGLYCERIN 0.2 MG/HR TD PT24: MEDICATED_PATCH | TRANSDERMAL | 1 refills | Status: DC

## 2020-08-05 NOTE — Progress Notes (Addendum)
    SUBJECTIVE:   CHIEF COMPLAINT / HPI:   Left heel pain Henry [name] is a 74 year old male presenting for follow-up after about 4-5 months of left heel pain.  He states this initially started while running placed to warm up.  Since his last appointment he states he has not seen much improvement.  He states he has used the meloxicam as well as some of the exercises he was provided but continues to have pain.  He states that his pain is occurring each time he goes for a walk but does not seem to bother him much when he is around the home or on the golf course.  He states he has tried 3 different pair of shoes with no relief when he goes on walks.  PERTINENT  PMH / PSH: None relevant  OBJECTIVE:   BP 120/78   Ht 5\' 6"  (1.676 m)   Wt 155 lb (70.3 kg)   BMI 25.02 kg/m    Ankle/Foot, left: No visible erythema, swelling, ecchymosis, or bony deformity.  Tender to palpation approximately 4-5 cm superior to the insertion of the Achilles tendon.  Patient with 5/5 strength in all directions and full range of motion.  Fine touch sensation intact.  Imaging: Ultrasound of left Achilles tendon reveals several calcifications present approximately 4-5 cm proximal to insertion of the Achilles tendon consistent with the location of the patient's pain.  Approximately 3-4 cm proximal to the insertion of the Achilles tendon the diameter of the Achilles was measured in long axis at 0.91 cm.  No obvious calcaneal spurs noted.  ASSESSMENT/PLAN:   Tendonitis, Achilles, left Assessment: 74 year old male with 4-5 months of ongoing left Achilles discomfort that is worse with walking.  Patient has performed some at home rehabilitation exercises and tried meloxicam without much relief.  Patient has not yet tried formal physical therapy.  Ultrasound imaging performed today shows several calcifications in the area of the patient's discomfort.  These are consistent with previous trauma or injury along with  tendinitis/tendinopathy. Plan: -We will get patient into formal physical therapy -We will trial nitroglycerin patches to increase blood flow to the area of injury to hopefully promote healing -Plan for follow-up with the patient in 4 weeks   Henry [name]    This note was prepared using Dragon voice recognition software and may include unintentional dictation errors due to the inherent limitations of voice recognition software.  Resident Attestation   I saw and evaluated the patient, performing the key elements of the service. I personally performed or re-performed the history, physical exam, and medical decision making activities of this service and have verified that the service and findings are accurately documented in the resident's note. I developed the management plan that is described in the resident's note, and I agree with the content, with my edits included.  Henry Rutherford, DO [facility], PGY-4  Addendum:  Patient seen in the office by fellow and resident.  Their history, exam, plan of care were precepted with me.  Henry Lemon MD Henry [name]

## 2020-08-05 NOTE — Patient Instructions (Addendum)
It was great to see you today! Thank you for letting me participate in your care!  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 I have prescribed for you nitro patches to be used as directed below. Please begin using these every day and continue your home exercise routine. Avoid steep inclines for now.   Please follow up with me after you have started formal physical therapy for a few weeks.  Nitroglycerin Protocol   Apply 1/4 nitroglycerin patch to affected area daily.  Change position of patch within the affected area every 24 hours.  You may experience a headache during the first 1-2 weeks of using the patch, these should subside.  If you experience headaches after beginning nitroglycerin patch treatment, you may take your preferred over the counter pain reliever.  Another side effect of the nitroglycerin patch is skin irritation or rash related to patch adhesive.  Please notify our office if you develop more severe headaches or rash, and stop the patch.  Tendon healing with nitroglycerin patch may require 12 to 24 weeks depending on the extent of injury.  Men should not use if taking Viagra, Cialis, or Levitra.   Do not use if you have migraines or rosacea.    Be well, Harolyn Rutherford, DO PGY-4, Sports Medicine Fellow [place]

## 2020-08-05 NOTE — Assessment & Plan Note (Signed)
Assessment: 74 year old male with 4-5 months of ongoing left Achilles discomfort that is worse with walking.  Patient has performed some at home rehabilitation exercises and tried meloxicam without much relief.  Patient has not yet tried formal physical therapy.  Ultrasound imaging performed today shows several calcifications in the area of the patient's discomfort.  These are consistent with previous trauma or injury along with tendinitis/tendinopathy. Plan: -We will get patient into formal physical therapy -We will trial nitroglycerin patches to increase blood flow to the area of injury to hopefully promote healing -Plan for follow-up with the patient in 4 weeks

## 2020-08-11 DIAGNOSIS — Z125 Encounter for screening for malignant neoplasm of prostate: Secondary | ICD-10-CM | POA: Diagnosis not present

## 2020-08-11 DIAGNOSIS — R5383 Other fatigue: Secondary | ICD-10-CM | POA: Diagnosis not present

## 2020-08-11 DIAGNOSIS — E7800 Pure hypercholesterolemia, unspecified: Secondary | ICD-10-CM | POA: Diagnosis not present

## 2020-08-11 DIAGNOSIS — R69 Illness, unspecified: Secondary | ICD-10-CM | POA: Diagnosis not present

## 2020-08-28 DIAGNOSIS — R0989 Other specified symptoms and signs involving the circulatory and respiratory systems: Secondary | ICD-10-CM | POA: Diagnosis not present

## 2020-08-28 DIAGNOSIS — N529 Male erectile dysfunction, unspecified: Secondary | ICD-10-CM | POA: Diagnosis not present

## 2020-08-28 DIAGNOSIS — K22719 Barrett's esophagus with dysplasia, unspecified: Secondary | ICD-10-CM | POA: Diagnosis not present

## 2020-08-28 DIAGNOSIS — J309 Allergic rhinitis, unspecified: Secondary | ICD-10-CM | POA: Diagnosis not present

## 2020-08-28 DIAGNOSIS — Z0001 Encounter for general adult medical examination with abnormal findings: Secondary | ICD-10-CM | POA: Diagnosis not present

## 2020-08-28 DIAGNOSIS — K219 Gastro-esophageal reflux disease without esophagitis: Secondary | ICD-10-CM | POA: Diagnosis not present

## 2020-08-28 DIAGNOSIS — N400 Enlarged prostate without lower urinary tract symptoms: Secondary | ICD-10-CM | POA: Diagnosis not present

## 2020-08-28 DIAGNOSIS — Z1212 Encounter for screening for malignant neoplasm of rectum: Secondary | ICD-10-CM | POA: Diagnosis not present

## 2020-08-28 DIAGNOSIS — E7800 Pure hypercholesterolemia, unspecified: Secondary | ICD-10-CM | POA: Diagnosis not present

## 2020-09-10 DIAGNOSIS — Z0100 Encounter for examination of eyes and vision without abnormal findings: Secondary | ICD-10-CM | POA: Diagnosis not present

## 2020-09-10 DIAGNOSIS — R0989 Other specified symptoms and signs involving the circulatory and respiratory systems: Secondary | ICD-10-CM | POA: Diagnosis not present

## 2020-09-10 DIAGNOSIS — H2513 Age-related nuclear cataract, bilateral: Secondary | ICD-10-CM | POA: Diagnosis not present

## 2020-09-10 DIAGNOSIS — D3131 Benign neoplasm of right choroid: Secondary | ICD-10-CM | POA: Diagnosis not present

## 2020-10-28 ENCOUNTER — Other Ambulatory Visit: Payer: Self-pay | Admitting: Family Medicine

## 2020-11-06 ENCOUNTER — Other Ambulatory Visit: Payer: Self-pay

## 2020-11-06 MED ORDER — MELOXICAM 15 MG PO TABS: ORAL_TABLET | ORAL | 0 refills | Status: DC

## 2020-11-06 NOTE — Addendum Note (Signed)
Addended by: Jolinda Croak E on: 11/06/2020 10:11 AM   Modules accepted: Orders

## 2020-11-11 ENCOUNTER — Other Ambulatory Visit: Payer: Self-pay

## 2020-11-11 ENCOUNTER — Ambulatory Visit (INDEPENDENT_AMBULATORY_CARE_PROVIDER_SITE_OTHER): Payer: Medicare HMO | Admitting: Family Medicine

## 2020-11-11 DIAGNOSIS — M778 Other enthesopathies, not elsewhere classified: Secondary | ICD-10-CM

## 2020-11-11 NOTE — Assessment & Plan Note (Signed)
75 yo man with tendinitis of left medial elbow confirmed with [place]. Patient to rest elbow for 7 days, then continue with ROM and isometric regimen to then graduate to strength building exercises. Recommend continue mobic for pain, compression sleeve for swelling and when active (playing golf). Can use nitroglycerin patches as well. Follow up in 4 weeks if continued pain.

## 2020-11-11 NOTE — Progress Notes (Addendum)
    SUBJECTIVE:   CHIEF COMPLAINT / HPI: left medial elbow pain  75 yo gentleman known to [facility] presents today with 2 weeks of left medial elbow pain. Patient was moving 200 lb motorized beds up two flights of stairs and lifted them in a bicep curl, when he felt pain his left medial elbow. Patient reports that he has had tendinitis in this area as well off and on for years, but pain has not been this intense or longlasting. He mostly feels the pain when he golfs. He is right-handed. He has used mobic, rest, and ice for pain, all of which have helped. He also started doing ROM exercises and isometric exercises 1 week ago, he has not done any weight lifting. He has a compression sleeve that he has not used yet.  Of note, he reports that his achilles tendinitis has fully healed after 5 months of pain.  PERTINENT  PMH / PSH: achilles tendinitis  OBJECTIVE:   BP 124/74   Ht 5\' 6"  (1.676 m)   Wt 155 lb (70.3 kg)   BMI 25.02 kg/m   Nursing note and vitals reviewed GEN: age-appropriate WM, resting comfortably in chair, NAD, WNWD Left elbow: Patient reports TTP at the medial epicondyle. Inspection yields evidence of effusion; no evidence of bony deformity, erythema, ecchymosis, rash. Active and passive ROM intact in flexion/extension/supination/pronation. Strength 5/5 throughout. No pain with gripping or finger/wrist flexion against resistance. Neuro: Alert and at baseline Psych: Pleasant and appropriate   Limited MSK U/S: Left Elbow [place]: No evidence of calcifications at the tendon insertion on the medial epicondyle. No fiber disruption of the tendon. Large hypoechoic fluid collection seen at the medial epicondyle with evidence of neovascularity seen on doppler. Impression: Left medial epicondylitis ASSESSMENT/PLAN:   Tendinitis of left elbow 75 yo man with tendinitis of left medial elbow confirmed with [place]. Patient to rest elbow for 7 days, then continue with ROM and isometric regimen to then  graduate to strength building exercises. Recommend continue mobic for pain, compression sleeve for swelling and when active (playing golf). Can use nitroglycerin patches as well. Follow up in 4 weeks if continued pain.     Gladys Damme, MD [place]   Resident Attestation   I saw and evaluated the patient, performing the key elements of the service.I  personally performed or re-performed the history, physical exam, and medical decision making activities of this service and have verified that the service and findings are accurately documented in the resident's note. I developed the management plan that is described in the resident's note, and I agree with the content, with my edits above.  Harolyn Rutherford, DO [facility], PGY-4  Addendum:  I was the preceptor for this visit and available for immediate consultation.  Karlton Lemon MD Kirt Boys

## 2020-12-08 DIAGNOSIS — M25572 Pain in left ankle and joints of left foot: Secondary | ICD-10-CM | POA: Diagnosis not present

## 2020-12-28 DIAGNOSIS — M25572 Pain in left ankle and joints of left foot: Secondary | ICD-10-CM | POA: Diagnosis not present

## 2021-01-05 DIAGNOSIS — M7662 Achilles tendinitis, left leg: Secondary | ICD-10-CM | POA: Diagnosis not present

## 2021-06-06 ENCOUNTER — Encounter: Payer: Self-pay | Admitting: Gastroenterology

## 2021-07-16 ENCOUNTER — Encounter: Payer: Self-pay | Admitting: Gastroenterology

## 2021-08-16 ENCOUNTER — Encounter: Payer: Self-pay | Admitting: *Deleted

## 2021-08-16 DIAGNOSIS — R0989 Other specified symptoms and signs involving the circulatory and respiratory systems: Secondary | ICD-10-CM | POA: Insufficient documentation

## 2021-08-27 DIAGNOSIS — E7801 Familial hypercholesterolemia: Secondary | ICD-10-CM | POA: Diagnosis not present

## 2021-08-27 DIAGNOSIS — Z125 Encounter for screening for malignant neoplasm of prostate: Secondary | ICD-10-CM | POA: Diagnosis not present

## 2021-08-31 DIAGNOSIS — Z0000 Encounter for general adult medical examination without abnormal findings: Secondary | ICD-10-CM | POA: Diagnosis not present

## 2021-08-31 DIAGNOSIS — N400 Enlarged prostate without lower urinary tract symptoms: Secondary | ICD-10-CM | POA: Diagnosis not present

## 2021-08-31 DIAGNOSIS — K219 Gastro-esophageal reflux disease without esophagitis: Secondary | ICD-10-CM | POA: Diagnosis not present

## 2021-08-31 DIAGNOSIS — N529 Male erectile dysfunction, unspecified: Secondary | ICD-10-CM | POA: Diagnosis not present

## 2021-08-31 DIAGNOSIS — R972 Elevated prostate specific antigen [PSA]: Secondary | ICD-10-CM | POA: Diagnosis not present

## 2021-08-31 DIAGNOSIS — E7800 Pure hypercholesterolemia, unspecified: Secondary | ICD-10-CM | POA: Diagnosis not present

## 2021-08-31 DIAGNOSIS — J309 Allergic rhinitis, unspecified: Secondary | ICD-10-CM | POA: Diagnosis not present

## 2021-08-31 DIAGNOSIS — M79673 Pain in unspecified foot: Secondary | ICD-10-CM | POA: Diagnosis not present

## 2021-08-31 DIAGNOSIS — K22719 Barrett's esophagus with dysplasia, unspecified: Secondary | ICD-10-CM | POA: Diagnosis not present

## 2021-09-02 ENCOUNTER — Ambulatory Visit (AMBULATORY_SURGERY_CENTER): Payer: Medicare HMO | Admitting: *Deleted

## 2021-09-02 ENCOUNTER — Encounter: Payer: Self-pay | Admitting: Gastroenterology

## 2021-09-02 ENCOUNTER — Other Ambulatory Visit: Payer: Self-pay | Admitting: Internal Medicine

## 2021-09-02 ENCOUNTER — Other Ambulatory Visit: Payer: Self-pay

## 2021-09-02 VITALS — Ht 65.0 in | Wt 155.0 lb

## 2021-09-02 DIAGNOSIS — Z0000 Encounter for general adult medical examination without abnormal findings: Secondary | ICD-10-CM

## 2021-09-02 DIAGNOSIS — K2270 Barrett's esophagus without dysplasia: Secondary | ICD-10-CM

## 2021-09-02 NOTE — Progress Notes (Signed)

## 2021-09-13 ENCOUNTER — Ambulatory Visit: Payer: Medicare HMO | Admitting: Family Medicine

## 2021-09-13 ENCOUNTER — Encounter: Payer: Self-pay | Admitting: Family Medicine

## 2021-09-13 VITALS — BP 126/82 | Ht 66.0 in | Wt 155.0 lb

## 2021-09-13 DIAGNOSIS — M722 Plantar fascial fibromatosis: Secondary | ICD-10-CM | POA: Diagnosis not present

## 2021-09-13 NOTE — Progress Notes (Addendum)
° °  SUBJECTIVE:   CHIEF COMPLAINT / HPI:    Henry [name] is a 75 y.o. male here for left heel pain for the past two weeks. Denies recent injury.  Has been performing home exercises that help. Pain worse when he first stands in the morning. Notes yesterday that he drove 3 hours from [place] and his pain increased.  He took Mobic and rolled his foot on a ice cold water bottle several times that day and pain resolved. Woke up this morning in no pain. States about 30 years ago he got a injection into his foot that resolved his pain instantly.    PERTINENT  PMH / PSH: reviewed and updated as appropriate   OBJECTIVE:   BP 126/82    Ht 5\' 6"  (1.676 m)    Wt 155 lb (70.3 kg)    BMI 25.02 kg/m    GEN: well appearing elderly male in no acute distress  CVS: well perfused  RESP: speaking in full sentences without pause, no respiratory distress  MSK: right foot  - No visible erythema, swelling, ecchymosis, or bony deformity. No notable pes planus/cavus deformity. Transverse arch and long arch grossly intact; No evidence of tibiotalar deviation -Range of motion is full in all directions. Strength is 5/5 in all directions. No tenderness at the myotendinous junction of the Achilles tendon; No peroneal tendon tenderness; No tenderness on posterior aspects of lateral and medial malleolus -Stable lateral and medial ligaments; Unremarkable syndesmotic squeeze test; Talar dome non-tender; Unremarkable calcaneal squeeze; No plantar calcaneal tenderness; No medical calcaneal tuberosity tenderness, No tenderness at the distal metatarsals -Able to walk 4 steps.    ASSESSMENT/PLAN:   Plantar Fascitis  Discussed home stretches and exercises. Ice as needed. Avoid flat shoes. Use heel lift. Consider steroid injection if significant pain returns/not resolving. Defer physical therapy for now. Follow up as needed.    Lyndee Hensen, DO PGY-3, [facility] 09/13/2021

## 2021-09-13 NOTE — Patient Instructions (Signed)
You have plantar fasciitis Plantar fascia stretch for 20-30 seconds (do 3 of these) in morning Lowering/raise on a step exercises 3 x 10 once or twice a day - this is very important for long term recovery. Ice heel for 15 minutes as needed. Avoid flat shoes/barefoot walking as much as possible. Inserts are important (dr. Zoe Lan active series, spencos, our green insoles, custom orthotics). Steroid injection is a consideration for short term pain relief if you are struggling. Physical therapy is also an option. Follow up with me as needed.

## 2021-09-14 ENCOUNTER — Encounter: Payer: Self-pay | Admitting: Gastroenterology

## 2021-09-14 ENCOUNTER — Other Ambulatory Visit: Payer: Self-pay

## 2021-09-14 ENCOUNTER — Ambulatory Visit (AMBULATORY_SURGERY_CENTER): Payer: Medicare HMO | Admitting: Gastroenterology

## 2021-09-14 VITALS — BP 144/69 | HR 59 | Temp 98.2°F | Resp 16 | Ht 66.0 in | Wt 155.0 lb

## 2021-09-14 DIAGNOSIS — K200 Eosinophilic esophagitis: Secondary | ICD-10-CM | POA: Diagnosis not present

## 2021-09-14 DIAGNOSIS — K2270 Barrett's esophagus without dysplasia: Secondary | ICD-10-CM

## 2021-09-14 DIAGNOSIS — K449 Diaphragmatic hernia without obstruction or gangrene: Secondary | ICD-10-CM

## 2021-09-14 DIAGNOSIS — K219 Gastro-esophageal reflux disease without esophagitis: Secondary | ICD-10-CM

## 2021-09-14 HISTORY — PX: ESOPHAGOGASTRODUODENOSCOPY: SHX1529

## 2021-09-14 MED ORDER — SODIUM CHLORIDE 0.9 % IV SOLN: 500.0000 mL | Freq: Once | INTRAVENOUS | Status: DC

## 2021-09-14 NOTE — Progress Notes (Signed)
[facility] History and Physical   Primary Care Physician:  Deland Pretty, MD   Reason for Procedure:   Barrett's esophagus  Plan:    EGD     HPI: Henry [name] is a 75 y.o. male  here for EGD surveillance of Barrett's esophagus. On omeprazole 40mg  / day with good control of symptoms. Has had stable short segment BE for some time. Otherwise feels well without any cardiopulmonary symptoms.    Past Medical History:  Diagnosis Date   Adenomatous colon polyp    Barrett's esophagus    Diverticulosis    GERD (gastroesophageal reflux disease)    Internal hemorrhoids    Kidney stone    2 stones over 15 years ago   Tinnitus, bilateral     uses hearing aid for noises/ no hearing loss    Past Surgical History:  Procedure Laterality Date   ABDOMINAL HERNIA REPAIR  2008   COLONOSCOPY  04/12/2018   LUMBAR LAMINECTOMY  1986   UPPER GI ENDOSCOPY      Prior to Admission medications   Medication Sig Start Date End Date Taking? Authorizing Provider  Cholecalciferol 5000 UNITS capsule Take 5,000 Units by mouth daily.    Yes [provider]  Omega-3 Fatty Acids (FISH OIL) 1000 MG CAPS 5 capsules   Yes [provider]  omeprazole (PRILOSEC) 40 MG capsule Take 40 mg by mouth every morning. 08/31/21  Yes [provider]  acetaminophen (TYLENOL) 325 MG tablet Take 650 mg by mouth. Take 2 pills daily as needed    [provider]  famciclovir (FAMVIR) 500 MG tablet TAKE 1 2 (ONE HALF) TABLET BY MOUTH THREE TIMES DAILY FOR 5 DAYS AS NEEDED FOR FLARE UP 03/05/18   [provider]  meclizine (ANTIVERT) 25 MG tablet Take 1 tablet (25 mg total) by mouth 3 (three) times daily as needed for up to 25 doses for dizziness. 08/31/19   Wyvonnia Dusky, MD  ondansetron (ZOFRAN ODT) 4 MG disintegrating tablet Take 1 tablet (4 mg total) by mouth every 8 (eight) hours as needed for up to 15 doses for nausea or vomiting. 08/31/19   Wyvonnia Dusky, MD   tadalafil (CIALIS) 20 MG tablet Take 20 mg by mouth as needed.    [provider]    Current Outpatient Medications  Medication Sig Dispense Refill   Cholecalciferol 5000 UNITS capsule Take 5,000 Units by mouth daily.      Omega-3 Fatty Acids (FISH OIL) 1000 MG CAPS 5 capsules     omeprazole (PRILOSEC) 40 MG capsule Take 40 mg by mouth every morning.     acetaminophen (TYLENOL) 325 MG tablet Take 650 mg by mouth. Take 2 pills daily as needed     famciclovir (FAMVIR) 500 MG tablet TAKE 1 2 (ONE HALF) TABLET BY MOUTH THREE TIMES DAILY FOR 5 DAYS AS NEEDED FOR FLARE UP  1   meclizine (ANTIVERT) 25 MG tablet Take 1 tablet (25 mg total) by mouth 3 (three) times daily as needed for up to 25 doses for dizziness. 25 tablet 0   ondansetron (ZOFRAN ODT) 4 MG disintegrating tablet Take 1 tablet (4 mg total) by mouth every 8 (eight) hours as needed for up to 15 doses for nausea or vomiting. 15 tablet 0   tadalafil (CIALIS) 20 MG tablet Take 20 mg by mouth as needed.     Current Facility-Administered Medications  Medication Dose Route Frequency Provider Last Rate Last Admin   0.9 %  sodium chloride infusion  500 mL Intravenous Once Stefannie Defeo, Carlota Raspberry, MD        Allergies as of 09/14/2021   (No Known Allergies)    Family History  Problem Relation Age of Onset   Alzheimer's disease Mother    Alcohol abuse Father    Brain cancer Paternal Grandfather    Colon cancer Neg Hx    Esophageal cancer Neg Hx    Stomach cancer Neg Hx     Social History   Socioeconomic History   Marital status: Married    Spouse name: Not on file   Number of children: 3   Years of education: Not on file   Highest education level: Not on file  Occupational History   Occupation: retired  Tobacco Use   Smoking status: Some Days    Types: Cigars   Smokeless tobacco: Never   Tobacco comments:    occasional  Vaping Use   Vaping Use: Never used  Substance and Sexual Activity   Alcohol use: No     Alcohol/week: 0.0 standard drinks   Drug use: No   Sexual activity: Not on file  Other Topics Concern   Not on file  Social History Narrative   Not on file   Social Determinants of Health   Financial Resource Strain: Not on file  Food Insecurity: Not on file  Transportation Needs: Not on file  Physical Activity: Not on file  Stress: Not on file  Social Connections: Not on file  Intimate Partner Violence: Not on file    Review of Systems: All other review of systems negative except as mentioned in the HPI.  Physical Exam: Vital signs BP (!) 159/82    Pulse 62    Temp 98.2 F (36.8 C)    Resp 13    Ht 5\' 6"  (1.676 m)    Wt 155 lb (70.3 kg)    SpO2 100%    BMI 25.02 kg/m   General:   Alert,  Well-developed, pleasant and cooperative in NAD Lungs:  Clear throughout to auscultation.   Heart:  Regular rate and rhythm Abdomen:  Soft, nontender and nondistended.   Neuro/Psych:  Alert and cooperative. Normal mood and affect. A and O x 3  Jolly Mango, MD [facility]

## 2021-09-14 NOTE — Patient Instructions (Signed)
Information on hiatal hernia and Barrett's esophagus given to you today.  Await pathology results from the biopsies taken today.  Resume previous diet and medications.   YOU HAD AN ENDOSCOPIC PROCEDURE TODAY AT [facility]:   Refer to the procedure report that was given to you for any specific questions about what was found during the examination.  If the procedure report does not answer your questions, please call your gastroenterologist to clarify.  If you requested that your care partner not be given the details of your procedure findings, then the procedure report has been included in a sealed envelope for you to review at your convenience later.  YOU SHOULD EXPECT: Some feelings of bloating in the abdomen. Passage of more gas than usual.  Walking can help get rid of the air that was put into your GI tract during the procedure and reduce the bloating. If you had a lower endoscopy (such as a colonoscopy or flexible sigmoidoscopy) you may notice spotting of blood in your stool or on the toilet paper. If you underwent a bowel prep for your procedure, you may not have a normal bowel movement for a few days.  Please Note:  You might notice some irritation and congestion in your nose or some drainage.  This is from the oxygen used during your procedure.  There is no need for concern and it should clear up in a day or so.  SYMPTOMS TO REPORT IMMEDIATELY:   Following upper endoscopy (EGD)  Vomiting of blood or coffee ground material  New chest pain or pain under the shoulder blades  Painful or persistently difficult swallowing  New shortness of breath  Fever of 100F or higher  Black, tarry-looking stools  For urgent or emergent issues, a gastroenterologist can be reached at any hour by calling [phone]. Do not use MyChart messaging for urgent concerns.    DIET:  We do recommend a small meal at first, but then you may proceed to your regular diet.  Drink plenty of  fluids but you should avoid alcoholic beverages for 24 hours.  ACTIVITY:  You should plan to take it easy for the rest of today and you should NOT DRIVE or use heavy machinery until tomorrow (because of the sedation medicines used during the test).    FOLLOW UP: Our staff will call the number listed on your records 48-72 hours following your procedure to check on you and address any questions or concerns that you may have regarding the information given to you following your procedure. If we do not reach you, we will leave a message.  We will attempt to reach you two times.  During this call, we will ask if you have developed any symptoms of COVID 19. If you develop any symptoms (ie: fever, flu-like symptoms, shortness of breath, cough etc.) before then, please call [phone].  If you test positive for Covid 19 in the 2 weeks post procedure, please call and report this information to [place].    If any biopsies were taken you will be contacted by phone or by letter within the next 1-3 weeks.  Please call us at [phone] if you have not heard about the biopsies in 3 weeks.    SIGNATURES/CONFIDENTIALITY: You and/or your care partner have signed paperwork which will be entered into your electronic medical record.  These signatures attest to the fact that that the information above on your After Visit Summary has been reviewed and is understood.  Full  responsibility of the confidentiality of this discharge information lies with you and/or your care-partner.

## 2021-09-14 NOTE — Progress Notes (Signed)
C.W. vital signs. 

## 2021-09-14 NOTE — Progress Notes (Signed)
Pt's states no medical or surgical changes since previsit or office visit. 

## 2021-09-14 NOTE — Op Note (Signed)
[facility] Patient Name: Henry [name] Procedure Date: 09/14/2021 9:48 AM MRN: [id number] Endoscopist: Remo Lipps P. Havery Moros , MD Age: 75 Referring MD:  Date of Birth: 26-May-1946 Gender: Male Account #: [id number] Procedure:                Upper GI endoscopy Indications:              Surveillance for malignancy due to personal history                            of Barrett's esophagus - short segment, on                            omeprazole 40mg  / day with good control of GERD Medicines:                Monitored Anesthesia Care Procedure:                Pre-Anesthesia Assessment:                           - Prior to the procedure, a History and Physical                            was performed, and patient medications and                            allergies were reviewed. The patient's tolerance of                            previous anesthesia was also reviewed. The risks                            and benefits of the procedure and the sedation                            options and risks were discussed with the patient.                            All questions were answered, and informed consent                            was obtained. Prior Anticoagulants: The patient has                            taken no previous anticoagulant or antiplatelet                            agents. ASA Grade Assessment: II - A patient with                            mild systemic disease. After reviewing the risks                            and benefits, the patient was deemed in  satisfactory condition to undergo the procedure.                           After obtaining informed consent, the endoscope was                            passed under direct vision. Throughout the                            procedure, the patient's blood pressure, pulse, and                            oxygen saturations were monitored continuously. The                            Endoscope  was introduced through the mouth, and                            advanced to the second part of duodenum. The upper                            GI endoscopy was accomplished without difficulty.                            The patient tolerated the procedure well. Scope In: Scope Out: Findings:                 Esophagogastric landmarks were identified: the                            Z-line was found at 37 cm, the gastroesophageal                            junction was found at 37 cm and the upper extent of                            the gastric folds was found at 41 cm from the                            incisors.                           A 4 cm hiatal hernia was present.                           The Z-line was irregular with a roughly 1cm thin                            tongue of salmon colored mucosa, no changes                            compared to the last exam. Z line otherwise had  some slight irregularies in other areas a few mm in                            length without nodularity. Biopsies were taken with                            a cold forceps for histology.                           The exam of the esophagus was otherwise normal.                           The entire examined stomach was normal.                           The duodenal bulb and second portion of the                            duodenum were normal. Complications:            No immediate complications. Estimated blood loss:                            Minimal. Estimated Blood Loss:     Estimated blood loss was minimal. Impression:               - Esophagogastric landmarks identified.                           - 4 cm hiatal hernia.                           - Z-line irregular - no significant changes since                            the last exam. Biopsied.                           - Normal stomach.                           - Normal duodenal bulb and second portion of the                             duodenum. Recommendation:           - Patient has a contact number available for                            emergencies. The signs and symptoms of potential                            delayed complications were discussed with the                            patient. Return to normal activities tomorrow.  Written discharge instructions were provided to the                            patient.                           - Resume previous diet.                           - Continue present medications.                           - Await pathology results with further                            recommendations Remo Lipps P. Havery Moros, MD 09/14/2021 10:20:42 AM This report has been signed electronically.

## 2021-09-14 NOTE — Progress Notes (Signed)
0955 Robinul 0.1 mg IV given due large amount of secretions upon assessment.  MD made aware, vss

## 2021-09-14 NOTE — Progress Notes (Signed)
Called to room to assist during endoscopic procedure.  Patient ID and intended procedure confirmed with present staff. Received instructions for my participation in the procedure from the performing physician.  

## 2021-09-16 ENCOUNTER — Telehealth: Payer: Self-pay | Admitting: *Deleted

## 2021-09-16 NOTE — Telephone Encounter (Signed)
°  Follow up Call-  Call back number 09/14/2021  Post procedure Call Back phone  # [phone]  Permission to leave phone message Yes  Some recent data might be hidden     Patient questions:  Do you have a fever, pain , or abdominal swelling? No. Pain Score  0 *  Have you tolerated food without any problems? Yes.    Have you been able to return to your normal activities? Yes.    Do you have any questions about your discharge instructions: Diet   No. Medications  No. Follow up visit  No.  Do you have questions or concerns about your Care? No.  Actions: * If pain score is 4 or above: No action needed, pain <4.

## 2021-09-30 ENCOUNTER — Ambulatory Visit
Admission: RE | Admit: 2021-09-30 | Discharge: 2021-09-30 | Disposition: A | Discharge status: Home / Self Care | Payer: No Typology Code available for payment source | Source: Ambulatory Visit | Attending: Internal Medicine | Admitting: Internal Medicine

## 2021-09-30 DIAGNOSIS — Z0000 Encounter for general adult medical examination without abnormal findings: Secondary | ICD-10-CM

## 2021-10-15 DIAGNOSIS — R972 Elevated prostate specific antigen [PSA]: Secondary | ICD-10-CM | POA: Diagnosis not present

## 2021-11-24 DIAGNOSIS — R972 Elevated prostate specific antigen [PSA]: Secondary | ICD-10-CM | POA: Diagnosis not present

## 2021-11-24 DIAGNOSIS — C61 Malignant neoplasm of prostate: Secondary | ICD-10-CM | POA: Diagnosis not present

## 2021-11-29 DIAGNOSIS — R42 Dizziness and giddiness: Secondary | ICD-10-CM | POA: Diagnosis not present

## 2021-11-29 DIAGNOSIS — I2510 Atherosclerotic heart disease of native coronary artery without angina pectoris: Secondary | ICD-10-CM | POA: Diagnosis not present

## 2021-12-02 NOTE — Progress Notes (Incomplete)
GU Location of Tumor / Histology: Prostate Ca  If Prostate Cancer, Gleason Score is (4 + 3) and PSA is (4.88 as of 10/15/2021)  Biopsies  Dr. Diona Fanti      Past/Anticipated interventions by urology, if any:   Past/Anticipated interventions by medical oncology, if any:   Weight changes, if any:   IPSS: SHIM:  Bowel/Bladder complaints, if any:    Nausea/Vomiting, if any:   Pain issues, if any:    SAFETY ISSUES: Prior radiation?  Pacemaker/ICD?  Possible current pregnancy? Male Is the patient on methotrexate?  No  Current Complaints / other details:

## 2021-12-07 ENCOUNTER — Other Ambulatory Visit: Payer: Self-pay

## 2021-12-07 ENCOUNTER — Ambulatory Visit
Admission: RE | Admit: 2021-12-07 | Discharge: 2021-12-07 | Disposition: A | Discharge status: Home / Self Care | Payer: Medicare HMO | Source: Ambulatory Visit | Attending: Radiation Oncology | Admitting: Radiation Oncology

## 2021-12-07 VITALS — BP 131/78 | HR 78 | Temp 97.1°F | Resp 18 | Ht 66.0 in | Wt 152.1 lb

## 2021-12-07 DIAGNOSIS — C61 Malignant neoplasm of prostate: Secondary | ICD-10-CM | POA: Insufficient documentation

## 2021-12-07 DIAGNOSIS — Z7984 Long term (current) use of oral hypoglycemic drugs: Secondary | ICD-10-CM | POA: Insufficient documentation

## 2021-12-07 DIAGNOSIS — K2270 Barrett's esophagus without dysplasia: Secondary | ICD-10-CM | POA: Diagnosis not present

## 2021-12-07 DIAGNOSIS — Z79899 Other long term (current) drug therapy: Secondary | ICD-10-CM | POA: Insufficient documentation

## 2021-12-07 DIAGNOSIS — K219 Gastro-esophageal reflux disease without esophagitis: Secondary | ICD-10-CM | POA: Insufficient documentation

## 2021-12-07 DIAGNOSIS — E7800 Pure hypercholesterolemia, unspecified: Secondary | ICD-10-CM | POA: Insufficient documentation

## 2021-12-07 DIAGNOSIS — H9313 Tinnitus, bilateral: Secondary | ICD-10-CM | POA: Insufficient documentation

## 2021-12-07 DIAGNOSIS — Z86010 Personal history of colonic polyps: Secondary | ICD-10-CM | POA: Insufficient documentation

## 2021-12-07 DIAGNOSIS — Z0001 Encounter for general adult medical examination with abnormal findings: Secondary | ICD-10-CM | POA: Insufficient documentation

## 2021-12-07 DIAGNOSIS — J309 Allergic rhinitis, unspecified: Secondary | ICD-10-CM | POA: Insufficient documentation

## 2021-12-07 DIAGNOSIS — N529 Male erectile dysfunction, unspecified: Secondary | ICD-10-CM | POA: Insufficient documentation

## 2021-12-07 DIAGNOSIS — R42 Dizziness and giddiness: Secondary | ICD-10-CM | POA: Insufficient documentation

## 2021-12-07 DIAGNOSIS — H903 Sensorineural hearing loss, bilateral: Secondary | ICD-10-CM | POA: Insufficient documentation

## 2021-12-07 DIAGNOSIS — Z789 Other specified health status: Secondary | ICD-10-CM | POA: Insufficient documentation

## 2021-12-07 DIAGNOSIS — N400 Enlarged prostate without lower urinary tract symptoms: Secondary | ICD-10-CM | POA: Insufficient documentation

## 2021-12-07 DIAGNOSIS — G4700 Insomnia, unspecified: Secondary | ICD-10-CM | POA: Insufficient documentation

## 2021-12-07 DIAGNOSIS — Z87442 Personal history of urinary calculi: Secondary | ICD-10-CM | POA: Insufficient documentation

## 2021-12-07 DIAGNOSIS — Z191 Hormone sensitive malignancy status: Secondary | ICD-10-CM | POA: Diagnosis not present

## 2021-12-07 DIAGNOSIS — I2510 Atherosclerotic heart disease of native coronary artery without angina pectoris: Secondary | ICD-10-CM | POA: Insufficient documentation

## 2021-12-07 DIAGNOSIS — E559 Vitamin D deficiency, unspecified: Secondary | ICD-10-CM | POA: Insufficient documentation

## 2021-12-07 NOTE — Progress Notes (Signed)
Introduced myself to the patient, and his wife, as the prostate nurse navigator.  No barriers to care identified at this time.  He is here to discuss his radiation treatment options.  I gave him my business card and asked him to call me with questions or concerns.  Verbalized understanding.  

## 2021-12-07 NOTE — Progress Notes (Signed)
?Radiation Oncology         [phone] ?________________________________ ? ?Initial Outpatient Consultation ? ?Name: Henry [name] MRN: [id number]  ?Date: 12/07/2021  DOB: 02-Feb-1946 ? ?SU:PJSRP, Thayer Jew, MD  Franchot Gallo, MD  ? ?REFERRING PHYSICIAN: Franchot Gallo, MD ? ?DIAGNOSIS: 76 y.o. gentleman with Stage T1c adenocarcinoma of the prostate with Gleason score of 3+4, and PSA of 4.88. ? ?  ICD-10-CM   ?1. Malignant neoplasm of prostate ([facility])  C61   ?  ? ? ?HISTORY OF PRESENT ILLNESS: Henry [name] is a 76 y.o. male with a diagnosis of prostate cancer. He was noted to have an elevated PSA of 5.6 in 07/2020 and remained elevated at 5.3 when repeated on 08/27/2021, by his primary care physician, Dr. Shelia Media.  Prior PSA was 3.11 in 2019.  Accordingly, he was referred for evaluation in urology by Dr. Diona Fanti on 10/15/21,  digital rectal examination was performed at that time revealing no nodules.  There is no family history of prostate cancer and he had only minimal lower urinary tract symptoms.  A repeat PSA that day showed persistent elevation at 4.88.  Therefore, he proceeded to transrectal ultrasound with 12 biopsies of the prostate on 11/24/21.  The prostate volume measured 29.29 cc.  Out of 12 core biopsies, 6 were positive.  The maximum Gleason score was 3+4, and this was seen in the right base, left apex, left mid lateral (with perineural invasion), and left base lateral (with PNI). Additionally, Gleason 3+3 was seen in the left apex lateral and left mid (small focus) ? ?The patient reviewed the biopsy results with his urologist and he has kindly been referred today for discussion of potential radiation treatment options. ? ? ?PREVIOUS RADIATION THERAPY: No ? ?PAST MEDICAL HISTORY:  ?Past Medical History:  ?Diagnosis Date  ? Adenomatous colon polyp   ? Barrett's esophagus   ? Diverticulosis   ? GERD (gastroesophageal reflux disease)   ? Internal hemorrhoids   ? Kidney stone   ? 2 stones over 15 years  ago  ? Tinnitus, bilateral   ?  uses hearing aid for noises/ no hearing loss  ?   ? ?PAST SURGICAL HISTORY: ?Past Surgical History:  ?Procedure Laterality Date  ? ABDOMINAL HERNIA REPAIR  2008  ? COLONOSCOPY  04/12/2018  ? LUMBAR LAMINECTOMY  1986  ? UPPER GI ENDOSCOPY    ? ? ?FAMILY HISTORY:  ?Family History  ?Problem Relation Age of Onset  ? Alzheimer's disease Mother   ? Alcohol abuse Father   ? Brain cancer Paternal Grandfather   ? Colon cancer Neg Hx   ? Esophageal cancer Neg Hx   ? Stomach cancer Neg Hx   ? ? ?SOCIAL HISTORY:  ?Social History  ? ?Socioeconomic History  ? Marital status: Married  ?  Spouse name: Not on file  ? Number of children: 3  ? Years of education: Not on file  ? Highest education level: Not on file  ?Occupational History  ? Occupation: retired  ?Tobacco Use  ? Smoking status: Some Days  ?  Types: Cigars  ? Smokeless tobacco: Never  ? Tobacco comments:  ?  occasional  ?Vaping Use  ? Vaping Use: Never used  ?Substance and Sexual Activity  ? Alcohol use: No  ?  Alcohol/week: 0.0 standard drinks  ? Drug use: No  ? Sexual activity: Not on file  ?Other Topics Concern  ? Not on file  ?Social History Narrative  ? Not on file  ? ?  Social Determinants of Health  ? ?Financial Resource Strain: Not on file  ?Food Insecurity: Not on file  ?Transportation Needs: Not on file  ?Physical Activity: Not on file  ?Stress: Not on file  ?Social Connections: Not on file  ?Intimate Partner Violence: Not on file  ? ? ?ALLERGIES: Atorvastatin ? ?MEDICATIONS:  ?Current Outpatient Medications  ?Medication Sig Dispense Refill  ? acetaminophen (TYLENOL) 325 MG tablet Take 650 mg by mouth. Take 2 pills daily as needed    ? Cholecalciferol 5000 UNITS capsule Take 5,000 Units by mouth daily.     ? ezetimibe (ZETIA) 10 MG tablet Take 10 mg by mouth daily.    ? famciclovir (FAMVIR) 500 MG tablet TAKE 1 2 (ONE HALF) TABLET BY MOUTH THREE TIMES DAILY FOR 5 DAYS AS NEEDED FOR FLARE UP  1  ? meclizine (ANTIVERT) 25 MG tablet  Take 1 tablet (25 mg total) by mouth 3 (three) times daily as needed for up to 25 doses for dizziness. 25 tablet 0  ? meloxicam (MOBIC) 15 MG tablet Take 15 mg by mouth daily as needed.    ? Omega-3 Fatty Acids (FISH OIL) 1000 MG CAPS 5 capsules    ? omeprazole (PRILOSEC) 40 MG capsule Take 40 mg by mouth every morning.    ? ondansetron (ZOFRAN ODT) 4 MG disintegrating tablet Take 1 tablet (4 mg total) by mouth every 8 (eight) hours as needed for up to 15 doses for nausea or vomiting. 15 tablet 0  ? QUICKVUE AT-HOME COVID-19 TEST KIT See admin instructions.    ? SHINGRIX injection     ? tadalafil (CIALIS) 20 MG tablet Take 20 mg by mouth as needed.    ? ?Current Facility-Administered Medications  ?Medication Dose Route Frequency Provider Last Rate Last Admin  ? 0.9 %  sodium chloride infusion  500 mL Intravenous Once Armbruster, Carlota Raspberry, MD      ? ? ?REVIEW OF SYSTEMS:  On review of systems, the patient reports that he is doing well overall. He denies any chest pain, shortness of breath, cough, fevers, chills, night sweats, unintended weight changes. He denies any bowel disturbances, and denies abdominal pain, nausea or vomiting. He denies any new musculoskeletal or joint aches or pains. His IPSS was 4, indicating mild urinary symptoms with nocturia x2 and mild frequency. His SHIM was 7, indicating he has severe erectile dysfunction. A complete review of systems is obtained and is otherwise negative. ? ?  ?PHYSICAL EXAM:  ?Wt Readings from Last 3 Encounters:  ?12/07/21 152 lb 2 oz (69 kg)  ?09/14/21 155 lb (70.3 kg)  ?09/13/21 155 lb (70.3 kg)  ? ?Temp Readings from Last 3 Encounters:  ?12/07/21 (!) 97.1 ?F (36.2 ?C) (Temporal)  ?09/14/21 98.2 ?F (36.8 ?C)  ?08/31/19 (!) 97.4 ?F (36.3 ?C) (Oral)  ? ?BP Readings from Last 3 Encounters:  ?12/07/21 131/78  ?09/14/21 (!) 144/69  ?09/13/21 126/82  ? ?Pulse Readings from Last 3 Encounters:  ?12/07/21 78  ?09/14/21 (!) 59  ?08/31/19 66  ? ?Pain Assessment ?Pain Score:  0-No pain/10 ? ?In general this is a well appearing Caucasian male in no acute distress. He's alert and oriented x4 and appropriate throughout the examination. Cardiopulmonary assessment is negative for acute distress, and he exhibits normal effort.   ? ?KPS = 100 ? ?100 - Normal; no complaints; no evidence of disease. ?90   - Able to carry on normal activity; minor signs or symptoms of disease. ?80   - Normal  activity with effort; some signs or symptoms of disease. ?47   - Cares for self; unable to carry on normal activity or to do active work. ?60   - Requires occasional assistance, but is able to care for most of his personal needs. ?50   - Requires considerable assistance and frequent medical care. ?49   - Disabled; requires special care and assistance. ?30   - Severely disabled; hospital admission is indicated although death not imminent. ?20   - Very sick; hospital admission necessary; active supportive treatment necessary. ?10   - Moribund; fatal processes progressing rapidly. ?0     - Dead ? ?Karnofsky DA, Abelmann WH, Craver LS and Burchenal [facility] [phone]) The use of the nitrogen mustards in the palliative treatment of carcinoma: with particular reference to bronchogenic carcinoma Cancer 1 634-56 ? ?LABORATORY DATA:  ?Lab Results  ?Component Value Date  ? WBC 7.3 08/31/2019  ? HGB 13.9 08/31/2019  ? HCT 43.1 08/31/2019  ? MCV 93.7 08/31/2019  ? PLT 240 08/31/2019  ? ?Lab Results  ?Component Value Date  ? NA 139 08/31/2019  ? K 4.2 08/31/2019  ? CL 106 08/31/2019  ? CO2 27 08/31/2019  ? ?No results found for: ALT, AST, GGT, ALKPHOS, BILITOT ?  ?RADIOGRAPHY: No results found. ?   ?IMPRESSION/PLAN: ?1. 76 y.o. gentleman with Stage T1c adenocarcinoma of the prostate with Gleason Score of 3+4, and PSA of 4.88. ?We discussed the patient's workup and outlined the nature of prostate cancer in this setting. The patient's T stage, Gleason's score, and PSA put him into the favorable intermediate risk group. Accordingly, he  is eligible for a variety of potential treatment options including brachytherapy, 5.5 weeks of external radiation, or prostatectomy. We discussed the available radiation techniques, and focused on the deta

## 2021-12-08 ENCOUNTER — Telehealth: Payer: Self-pay

## 2021-12-08 NOTE — Telephone Encounter (Signed)
RN returned patient call he just wanted the radiation video website.  Information was provided and walked him through pulling information up on his computer.  He was appreciative for the assistance.  No further concerns at this time. ?

## 2021-12-13 ENCOUNTER — Other Ambulatory Visit: Payer: Self-pay | Admitting: Urology

## 2021-12-14 ENCOUNTER — Other Ambulatory Visit: Payer: Self-pay | Admitting: Urology

## 2021-12-14 MED ORDER — FLEET ENEMA 7-19 GM/118ML RE ENEM: 1.0000 | ENEMA | Freq: Once | RECTAL | Status: AC

## 2021-12-16 ENCOUNTER — Telehealth: Payer: Self-pay | Admitting: *Deleted

## 2021-12-16 NOTE — Telephone Encounter (Signed)
CALLED PATIENT TO INFORM OF FID. MARKER AND SPACE OAR PLACEMENT ON 12-27-21 AND HIS SIM ON 12-29-21- ARRIVAL TIME- 12:45 PM, SPOKE WITH PATIENT AND HE IS AWARE OF THESE APPTS. ?

## 2021-12-21 ENCOUNTER — Encounter (HOSPITAL_BASED_OUTPATIENT_CLINIC_OR_DEPARTMENT_OTHER): Payer: Self-pay | Admitting: Urology

## 2021-12-21 ENCOUNTER — Other Ambulatory Visit: Payer: Self-pay

## 2021-12-21 NOTE — Progress Notes (Signed)
Spoke w/ via phone for pre-op interview--- pt ?Lab needs dos----  no             ?Lab results------ no ?COVID test -----patient states asymptomatic no test needed ?Arrive at ------- 1100 on 12-27-2021 ?NPO after MN NO Solid Food.  Clear liquids from MN until--- 1000 ?Med rec completed ?Medications to take morning of surgery ----- prilosec ?Diabetic medication ----- n/a ?Patient instructed no nail polish to be worn day of surgery ?Patient instructed to bring photo id and insurance card day of surgery ?Patient aware to have Driver (ride ) / caregiver for 24 hours after surgery -- wife, Belenda Cruise ?Patient Special Instructions ----- will do one fleet enema ?Pre-Op special Istructions ----- n/a ?Patient verbalized understanding of instructions that were given at this phone interview. ?Patient denies shortness of breath, chest pain, fever, cough at this phone interview.  ?

## 2021-12-27 ENCOUNTER — Encounter (HOSPITAL_BASED_OUTPATIENT_CLINIC_OR_DEPARTMENT_OTHER)
Admission: RE | Disposition: A | Discharge status: Home / Self Care | Payer: Self-pay | Source: Home / Self Care | Attending: Urology

## 2021-12-27 ENCOUNTER — Other Ambulatory Visit: Payer: Self-pay

## 2021-12-27 ENCOUNTER — Ambulatory Visit (HOSPITAL_BASED_OUTPATIENT_CLINIC_OR_DEPARTMENT_OTHER)
Admission: RE | Admit: 2021-12-27 | Discharge: 2021-12-27 | Disposition: A | Discharge status: Home / Self Care | Payer: Medicare HMO | Attending: Urology | Admitting: Urology

## 2021-12-27 ENCOUNTER — Ambulatory Visit (HOSPITAL_BASED_OUTPATIENT_CLINIC_OR_DEPARTMENT_OTHER): Payer: Medicare HMO | Admitting: Certified Registered"

## 2021-12-27 ENCOUNTER — Encounter (HOSPITAL_BASED_OUTPATIENT_CLINIC_OR_DEPARTMENT_OTHER): Payer: Self-pay | Admitting: Urology

## 2021-12-27 DIAGNOSIS — C61 Malignant neoplasm of prostate: Secondary | ICD-10-CM | POA: Diagnosis not present

## 2021-12-27 DIAGNOSIS — F17210 Nicotine dependence, cigarettes, uncomplicated: Secondary | ICD-10-CM | POA: Insufficient documentation

## 2021-12-27 DIAGNOSIS — R69 Illness, unspecified: Secondary | ICD-10-CM | POA: Diagnosis not present

## 2021-12-27 DIAGNOSIS — I2510 Atherosclerotic heart disease of native coronary artery without angina pectoris: Secondary | ICD-10-CM | POA: Diagnosis not present

## 2021-12-27 DIAGNOSIS — E785 Hyperlipidemia, unspecified: Secondary | ICD-10-CM

## 2021-12-27 DIAGNOSIS — K219 Gastro-esophageal reflux disease without esophagitis: Secondary | ICD-10-CM | POA: Diagnosis not present

## 2021-12-27 HISTORY — DX: Dizziness and giddiness: R42

## 2021-12-27 HISTORY — DX: Personal history of adenomatous and serrated colon polyps: Z86.0101

## 2021-12-27 HISTORY — DX: Personal history of colonic polyps: Z86.010

## 2021-12-27 HISTORY — PX: GOLD SEED IMPLANT: SHX6343

## 2021-12-27 HISTORY — DX: Presence of external hearing-aid: Z97.4

## 2021-12-27 HISTORY — DX: Personal history of other diseases of urinary system: Z87.448

## 2021-12-27 HISTORY — DX: Male erectile dysfunction, unspecified: N52.9

## 2021-12-27 HISTORY — PX: SPACE OAR INSTILLATION: SHX6769

## 2021-12-27 HISTORY — DX: Hyperlipidemia, unspecified: E78.5

## 2021-12-27 HISTORY — DX: Benign prostatic hyperplasia with lower urinary tract symptoms: N40.1

## 2021-12-27 HISTORY — DX: Personal history of urinary calculi: Z87.442

## 2021-12-27 HISTORY — DX: Allergic rhinitis, unspecified: J30.9

## 2021-12-27 HISTORY — DX: Barrett's esophagus without dysplasia: K22.70

## 2021-12-27 HISTORY — DX: Diverticulosis of large intestine without perforation or abscess without bleeding: K57.30

## 2021-12-27 HISTORY — DX: Presence of spectacles and contact lenses: Z97.3

## 2021-12-27 HISTORY — DX: Family history of other specified conditions: Z84.89

## 2021-12-27 SURGERY — INSERTION, GOLD SEEDS
Anesthesia: Monitor Anesthesia Care | Site: Prostate

## 2021-12-27 MED ORDER — LACTATED RINGERS IV SOLN: INTRAVENOUS | Status: DC

## 2021-12-27 MED ORDER — LIDOCAINE HCL 2 % IJ SOLN
INTRAMUSCULAR | Status: DC | PRN
  Administered 2021-12-27: 10 mL

## 2021-12-27 MED ORDER — PROPOFOL 500 MG/50ML IV EMUL
INTRAVENOUS | Status: AC
  Filled 2021-12-27: qty 50

## 2021-12-27 MED ORDER — PROPOFOL 10 MG/ML IV BOLUS
INTRAVENOUS | Status: DC | PRN
  Administered 2021-12-27: 50 mg via INTRAVENOUS

## 2021-12-27 MED ORDER — LIDOCAINE HCL (PF) 2 % IJ SOLN
INTRAMUSCULAR | Status: AC
  Filled 2021-12-27: qty 5

## 2021-12-27 MED ORDER — FENTANYL CITRATE (PF) 100 MCG/2ML IJ SOLN
INTRAMUSCULAR | Status: AC
  Filled 2021-12-27: qty 2

## 2021-12-27 MED ORDER — FENTANYL CITRATE (PF) 100 MCG/2ML IJ SOLN
INTRAMUSCULAR | Status: DC | PRN
  Administered 2021-12-27: 50 ug via INTRAVENOUS

## 2021-12-27 MED ORDER — SODIUM CHLORIDE (PF) 0.9 % IJ SOLN
INTRAMUSCULAR | Status: DC | PRN
  Administered 2021-12-27: 10 mL

## 2021-12-27 MED ORDER — PROPOFOL 500 MG/50ML IV EMUL
INTRAVENOUS | Status: DC | PRN
  Administered 2021-12-27: 150 ug/kg/min via INTRAVENOUS

## 2021-12-27 MED ORDER — PROPOFOL 10 MG/ML IV BOLUS
INTRAVENOUS | Status: AC
Start: 2021-12-27 — End: ?
  Filled 2021-12-27: qty 20

## 2021-12-27 SURGICAL SUPPLY — 25 items
BLADE CLIPPER SENSICLIP SURGIC (BLADE) ×2 IMPLANT
CNTNR URN SCR LID CUP LEK RST (MISCELLANEOUS) ×1 IMPLANT
CONT SPEC 4OZ STRL OR WHT (MISCELLANEOUS) ×2
COVER BACK TABLE 60X90IN (DRAPES) ×2 IMPLANT
DRSG IV TEGADERM 3.5X4.5 STRL (GAUZE/BANDAGES/DRESSINGS) IMPLANT
DRSG TEGADERM 4X4.75 (GAUZE/BANDAGES/DRESSINGS) ×2 IMPLANT
DRSG TEGADERM 8X12 (GAUZE/BANDAGES/DRESSINGS) ×2 IMPLANT
GAUZE SPONGE 4X4 12PLY STRL (GAUZE/BANDAGES/DRESSINGS) ×2 IMPLANT
GAUZE SPONGE 4X4 12PLY STRL LF (GAUZE/BANDAGES/DRESSINGS) IMPLANT
GLOVE SURG ENC MOIS LTX SZ8 (GLOVE) ×2 IMPLANT
IMPL SPACEOAR VUE SYSTEM (Spacer) ×1 IMPLANT
IMPLANT SPACEOAR VUE SYSTEM (Spacer) ×2 IMPLANT
KIT TURNOVER CYSTO (KITS) ×2 IMPLANT
MARKER GOLD PRELOAD 1.2X3 (Urological Implant) ×1 IMPLANT
MARKER SKIN DUAL TIP RULER LAB (MISCELLANEOUS) ×2 IMPLANT
NDL SPNL 22GX7 QUINCKE BK (NEEDLE) ×1 IMPLANT
NEEDLE SPNL 22GX7 QUINCKE BK (NEEDLE) ×2 IMPLANT
SEED GOLD PRELOAD 1.2X3 (Urological Implant) ×2 IMPLANT
SHEATH ULTRASOUND LF (SHEATH) IMPLANT
SHEATH ULTRASOUND LTX NONSTRL (SHEATH) ×1 IMPLANT
SURGILUBE 2OZ TUBE FLIPTOP (MISCELLANEOUS) ×2 IMPLANT
SYR 10ML LL (SYRINGE) ×1 IMPLANT
SYR CONTROL 10ML LL (SYRINGE) ×2 IMPLANT
TOWEL OR 17X26 10 PK STRL BLUE (TOWEL DISPOSABLE) ×2 IMPLANT
UNDERPAD 30X36 HEAVY ABSORB (UNDERPADS AND DIAPERS) ×3 IMPLANT

## 2021-12-27 NOTE — Anesthesia Postprocedure Evaluation (Signed)
Anesthesia Post Note ? ?Patient: Henry [name] ? ?Procedure(s) Performed: GOLD SEED IMPLANT (Prostate) ?SPACE OAR INSTILLATION (Prostate) ? ?  ? ?Patient location during evaluation: Phase II ?Anesthesia Type: MAC ?Level of consciousness: awake and alert and oriented ?Pain management: pain level controlled ?Vital Signs Assessment: post-procedure vital signs reviewed and stable ?Respiratory status: spontaneous breathing, nonlabored ventilation and respiratory function stable ?Cardiovascular status: stable and blood pressure returned to baseline ?Postop Assessment: no apparent nausea or vomiting ?Anesthetic complications: no ? ? ?No notable events documented. ? ?Last Vitals:  ?Vitals:  ? 12/27/21 1300 12/27/21 1315  ?BP:    ?Pulse: (!) 55 (!) 58  ?Resp: 14 17  ?Temp: 36.4 ?C   ?SpO2: 100% 100%  ?  ?Last Pain:  ?Vitals:  ? 12/27/21 1322  ?TempSrc:   ?PainSc: 0-No pain  ? ? ?  ?  ?  ?  ?  ?  ? ?Henry [name]. ? ? ? ? ?

## 2021-12-27 NOTE — Op Note (Signed)
Preoperative diagnosis: Intermediate risk prostate cancer ? ?Postoperative diagnosis: Same ? ?Principal procedure: Placement of fiducial markers and SpaceOAR ? ?Surgeon: Diona Fanti ? ?Anesthesia: MAC ? ?Complications: None ? ?Estimated blood loss: Less than 5 mL ? ?Indications: 76 year old male with newly diagnosed intermediate risk prostate cancer.  He is scheduled for IMRT for curative therapy.  He presents at this time for placement of fiducial markers as well as SpaceOAR before he commences with his curative therapy. ? ?Description of procedure: Patient was properly identifie preoperatively was taken to the operating room where monitored anesthesia care was administered.  He was placed in the dorsolithotomy position.  Genitalia were retracted anteriorly/superiorly and perineum was cleansed.  Transrectal ultrasound probe was passed into his rectum.  Serial scanning was performed in transverse and longitudinal dimensions.  Perineal skin and apex of the prostate was infiltrated with 10 cc of 2% lidocaine.  Following this, 3 fiducial markers were placed with ultrasound guidance.  The first was placed in the righ prostatic base, the second in the right prostatic apex, third was placed in the mid lateral prostate on the left.  Following this, 18-gauge spinal needle was utilized to puncture the "white space" between the rectum and prostate in the medial mid prostate.  Once adequately positioned, 5 cc of saline was used to create a space which was nicely seen with ultrasound guidance.  Once adequate positioning was confirmed, 20 mL of SpaceOAR was administered over period of 13 seconds.  This revealed excellent separation of the rectum and prostate bilaterally and from apex to base.  At this point the needle was removed.  The procedure was terminated.  The patient was then placed in supine position, awakened, and taken to the PACU in stable condition having tolerated the procedure well ?

## 2021-12-27 NOTE — Discharge Instructions (Addendum)
1.  Take it easy today ? ?2.  Increase activity slowly over the next week.  No significantly exertional activity for 3 to 4 days ? ?3.  Keep appointments with Dr. Tammi Klippel and [facility] ? ?4.  Report any difficulties with urination or defecation ?Post Anesthesia Home Care Instructions ? ?Activity: ?Get plenty of rest for the remainder of the day. A responsible adult should stay with you for 24 hours following the procedure.  ?For the next 24 hours, DO NOT: ?-Drive a car ?-Paediatric nurse ?-Drink alcoholic beverages ?-Take any medication unless instructed by your physician ?-Make any legal decisions or sign important papers. ? ?Meals: ?Start with liquid foods such as gelatin or soup. Progress to regular foods as tolerated. Avoid greasy, spicy, heavy foods. If nausea and/or vomiting occur, drink only clear liquids until the nausea and/or vomiting subsides. Call your physician if vomiting continues. ? ?Special Instructions/Symptoms: ?Your throat may feel dry or sore from the anesthesia or the breathing tube placed in your throat during surgery. If this causes discomfort, gargle with warm salt water. The discomfort should disappear within 24 hours. ? ? ?   ?

## 2021-12-27 NOTE — Transfer of Care (Signed)
Immediate Anesthesia Transfer of Care Note ? ?Patient: Henry [name] ? ?Procedure(s) Performed: Procedure(s) (LRB): ?GOLD SEED IMPLANT (N/A) ?SPACE OAR INSTILLATION (N/A) ? ?Patient Location: PACU ? ?Anesthesia Type: MAC ? ?Level of Consciousness: awake, alert , oriented and patient cooperative ? ?Airway & Oxygen Therapy: Patient Spontanous Breathing and Patient connected to face mask oxygen ? ?Post-op Assessment: Report given to PACU RN and Post -op Vital signs reviewed and stable ? ?Post vital signs: Reviewed and stable ? ?Complications: No apparent anesthesia complications ?Last Vitals:  ?Vitals Value Taken Time  ?BP 111/73 12/27/21 1253  ?Temp 36.4 ?C 12/27/21 1300  ?Pulse 56 12/27/21 1300  ?Resp 15 12/27/21 1300  ?SpO2 100 % 12/27/21 1300  ?Vitals shown include unvalidated device data. ? ?Last Pain:  ?Vitals:  ? 12/27/21 1105  ?TempSrc: Oral  ?   ? ?  ? ?Complications: No notable events documented. ?

## 2021-12-27 NOTE — H&P (Signed)
H&P ? ?Chief Complaint: Prostate cancer ? ?History of Present Illness: 76 year old male presents for placement of gold fiducial markers as well as SpaceOAR in advance of external beam radiotherapy for management of prostate cancer. ? ?Past Medical History:  ?Diagnosis Date  ? Allergic rhinitis   ? Barrett's esophagus without dysplasia   ? followed by dr Havery Moros  ? BPH associated with nocturia   ? Diverticulosis of colon   ? ED (erectile dysfunction)   ? Family history of adverse reaction to anesthesia   ? mother--- ponv  ? GERD (gastroesophageal reflux disease)   ? History of bladder stone   ? History of kidney stones   ? Hx of adenomatous polyp of colon   ? Hyperlipidemia   ? Internal hemorrhoids   ? Tinnitus, bilateral   ? Vertigo   ? Wears glasses   ? Wears hearing aid in both ears   ? ? ?Past Surgical History:  ?Procedure Laterality Date  ? ABDOMINAL HERNIA REPAIR  2008  ? COLONOSCOPY  01/17/2017  ? by dr Havery Moros  ? ESOPHAGOGASTRODUODENOSCOPY  09/14/2021  ? by dr Havery Moros  ? LUMBAR LAMINECTOMY  1978  ? per pt 2 wks later had  surgery to remove bone fragment  ? ? ?Home Medications:  ? ? ?Allergies:  ?Allergies  ?Allergen Reactions  ? Atorvastatin   ?  Other reaction(s): weakness and sob  ? ? ?Family History  ?Problem Relation Age of Onset  ? Alzheimer's disease Mother   ? Alcohol abuse Father   ? Brain cancer Paternal Grandfather   ? Colon cancer Neg Hx   ? Esophageal cancer Neg Hx   ? Stomach cancer Neg Hx   ? ? ?Social History:  reports that he has been smoking cigarettes and cigars. He has never used smokeless tobacco. He reports that he does not drink alcohol and does not use drugs. ? ?ROS: ?A complete review of systems was performed.  All systems are negative except for pertinent findings as noted. ? ?Physical Exam:  ?Vital signs in last 24 hours: ?BP 134/75   Pulse 70   Temp 97.9 ?F (36.6 ?C) (Oral)   Resp 16   Ht '5\' 6"'$  (1.676 m)   Wt 68.9 kg   SpO2 99%   BMI 24.52 kg/m?  ?Constitutional:   Alert and oriented, No acute distress ?Cardiovascular: Regular rate  ?Respiratory: Normal respiratory effort ?GI: Abdomen is soft, nontender, nondistended, no abdominal masses. No CVAT.  ?Genitourinary: Normal male phallus, testes are descended bilaterally and non-tender and without masses, scrotum is normal in appearance without lesions or masses, perineum is normal on inspection. ?Lymphatic: No lymphadenopathy ?Neurologic: Grossly intact, no focal deficits ?Psychiatric: Normal mood and affect ? ?I have reviewed prior pt notes ? ? ? ?Impression/Assessment:  ?Favorable intermediate risk prostate cancer ? ?Plan:  ?Placement of gold fiducial markers, SpaceOAR ? ?

## 2021-12-27 NOTE — Anesthesia Procedure Notes (Signed)
Procedure Name: Mary Esther ?Date/Time: 12/27/2021 12:23 PM ?Performed by: Suan Halter, CRNA ?Patient Re-evaluated:Patient Re-evaluated prior to induction ?Oxygen Delivery Method: Simple face mask ? ? ? ? ?

## 2021-12-27 NOTE — Anesthesia Preprocedure Evaluation (Addendum)
Anesthesia Evaluation  ?Patient identified by MRN, date of birth, ID band ?Patient awake ? ? ? ?Reviewed: ?Allergy & Precautions, NPO status , Patient's Chart, lab work & pertinent test results ? ?Airway ?Mallampati: II ? ?TM Distance: >3 FB ?Neck ROM: Full ? ? ? Dental ?no notable dental hx. ?(+) Teeth Intact, Caps, Dental Advisory Given, Implants ?  ?Pulmonary ?Current Smoker,  ?  ?Pulmonary exam normal ?breath sounds clear to auscultation ? ? ? ? ? ? Cardiovascular ?+ CAD  ?Normal cardiovascular exam ?Rhythm:Regular Rate:Normal ? ? ?  ?Neuro/Psych ?negative neurological ROS ? negative psych ROS  ? GI/Hepatic ?Neg liver ROS, GERD  Medicated,  ?Endo/Other  ?Hyperlipidemia ? Renal/GU ?negative Renal ROS  ? ?Prostate Ca ?negative genitourinary ?  ?Musculoskeletal ?negative musculoskeletal ROS ?(+)  ? Abdominal ?  ?Peds ? Hematology ?negative hematology ROS ?(+)   ?Anesthesia Other Findings ? ? Reproductive/Obstetrics ?ED ? ?  ? ? ? ? ? ? ? ? ? ? ? ? ? ?  ?  ? ? ? ? ? ? ?Anesthesia Physical ?Anesthesia Plan ? ?ASA: 2 ? ?Anesthesia Plan: MAC  ? ?Post-op Pain Management:   ? ?Induction: Intravenous ? ?PONV Risk Score and Plan: 1 and Propofol infusion and Treatment may vary due to age or medical condition ? ?Airway Management Planned: Natural Airway and Simple Face Mask ? ?Additional Equipment:  ? ?Intra-op Plan:  ? ?Post-operative Plan:  ? ?Informed Consent: I have reviewed the patients History and Physical, chart, labs and discussed the procedure including the risks, benefits and alternatives for the proposed anesthesia with the patient or authorized representative who has indicated his/her understanding and acceptance.  ? ? ? ?Dental advisory given ? ?Plan Discussed with: CRNA and Anesthesiologist ? ?Anesthesia Plan Comments:   ? ? ? ? ? ? ?Anesthesia Quick Evaluation ? ?

## 2021-12-27 NOTE — Interval H&P Note (Signed)
History and Physical Interval Note: ? ?12/27/2021 ?12:06 PM ? ?Henry [name]  has presented today for surgery, with the diagnosis of PROSTATE CANCER.  The various methods of treatment have been discussed with the patient and family. After consideration of risks, benefits and other options for treatment, the patient has consented to  Procedure(s): ?GOLD SEED IMPLANT (N/A) ?SPACE OAR INSTILLATION (N/A) as a surgical intervention.  The patient's history has been reviewed, patient examined, no change in status, stable for surgery.  I have reviewed the patient's chart and labs.  Questions were answered to the patient's satisfaction.   ? ? ?Henry [name] Henry [name] ? ? ?

## 2021-12-28 ENCOUNTER — Telehealth: Payer: Self-pay | Admitting: *Deleted

## 2021-12-28 ENCOUNTER — Encounter (HOSPITAL_BASED_OUTPATIENT_CLINIC_OR_DEPARTMENT_OTHER): Payer: Self-pay | Admitting: Urology

## 2021-12-28 NOTE — Telephone Encounter (Signed)
CALLED PATIENT TO REMIND OF SIM APPT. FOR 12-29-21- ARRIVAL TIME- 12:45 PM, @ [place], PATIENT INFORMED TO ARRIVE WITH FULL BLADDER AND AN EMPTY BOWEL, LVM FOR A RETURN CALL ?

## 2021-12-29 ENCOUNTER — Ambulatory Visit
Admission: RE | Admit: 2021-12-29 | Discharge: 2021-12-29 | Disposition: A | Discharge status: Home / Self Care | Payer: Medicare HMO | Source: Ambulatory Visit | Attending: Radiation Oncology | Admitting: Radiation Oncology

## 2021-12-29 ENCOUNTER — Other Ambulatory Visit: Payer: Self-pay

## 2021-12-29 DIAGNOSIS — Z510 Encounter for antineoplastic radiation therapy: Secondary | ICD-10-CM | POA: Insufficient documentation

## 2021-12-29 DIAGNOSIS — C61 Malignant neoplasm of prostate: Secondary | ICD-10-CM | POA: Diagnosis not present

## 2021-12-29 DIAGNOSIS — Z191 Hormone sensitive malignancy status: Secondary | ICD-10-CM | POA: Diagnosis not present

## 2021-12-29 NOTE — Progress Notes (Signed)
?  Radiation Oncology         [phone]) [phone] ?________________________________ ? ?Name: REASON HELZER MRN: [id number]  ?Date: 12/29/2021  DOB: October 25, 1945 ? ?SIMULATION AND TREATMENT PLANNING NOTE ? ?  ICD-10-CM   ?1. Malignant neoplasm of prostate ([place])  C61   ?  ? ? ?DIAGNOSIS:  76 y.o. gentleman with Stage T1c adenocarcinoma of the prostate with Gleason score of 3+4, and PSA of 4.88. ? ?NARRATIVE:  The patient was brought to the [facility].  Identity was confirmed.  All relevant records and images related to the planned course of therapy were reviewed.  The patient freely provided informed written consent to proceed with treatment after reviewing the details related to the planned course of therapy. The consent form was witnessed and verified by the simulation staff.  Then, the patient was set-up in a stable reproducible supine position for radiation therapy.  A vacuum lock pillow device was custom fabricated to position his legs in a reproducible immobilized position.  Then, I performed a urethrogram under sterile conditions to identify the prostatic apex.  CT images were obtained.  Surface markings were placed.  The CT images were loaded into the planning software.  Then the prostate target and avoidance structures including the rectum, bladder, bowel and hips were contoured.  Treatment planning then occurred.  The radiation prescription was entered and confirmed.  A total of one complex treatment devices was fabricated. I have requested : Intensity Modulated Radiotherapy (IMRT) is medically necessary for this case for the following reason:  Rectal sparing.. ? ?PLAN:  The patient will receive 70 Gy in 28 fractions. ? ?________________________________ ? ?Sheral Apley Tammi Klippel, M.D. ? ?

## 2022-01-04 DIAGNOSIS — M79641 Pain in right hand: Secondary | ICD-10-CM | POA: Diagnosis not present

## 2022-01-05 DIAGNOSIS — Z510 Encounter for antineoplastic radiation therapy: Secondary | ICD-10-CM | POA: Diagnosis not present

## 2022-01-05 DIAGNOSIS — C61 Malignant neoplasm of prostate: Secondary | ICD-10-CM | POA: Diagnosis not present

## 2022-01-05 DIAGNOSIS — Z191 Hormone sensitive malignancy status: Secondary | ICD-10-CM | POA: Diagnosis not present

## 2022-01-10 ENCOUNTER — Ambulatory Visit
Admission: RE | Admit: 2022-01-10 | Discharge: 2022-01-10 | Disposition: A | Discharge status: Home / Self Care | Payer: Medicare HMO | Source: Ambulatory Visit | Attending: Radiation Oncology | Admitting: Radiation Oncology

## 2022-01-10 ENCOUNTER — Other Ambulatory Visit: Payer: Self-pay

## 2022-01-10 DIAGNOSIS — Z191 Hormone sensitive malignancy status: Secondary | ICD-10-CM | POA: Diagnosis not present

## 2022-01-10 DIAGNOSIS — C61 Malignant neoplasm of prostate: Secondary | ICD-10-CM | POA: Diagnosis not present

## 2022-01-10 DIAGNOSIS — Z510 Encounter for antineoplastic radiation therapy: Secondary | ICD-10-CM | POA: Diagnosis not present

## 2022-01-11 ENCOUNTER — Ambulatory Visit
Admission: RE | Admit: 2022-01-11 | Discharge: 2022-01-11 | Disposition: A | Discharge status: Home / Self Care | Payer: Medicare HMO | Source: Ambulatory Visit | Attending: Radiation Oncology | Admitting: Radiation Oncology

## 2022-01-11 ENCOUNTER — Other Ambulatory Visit: Payer: Self-pay

## 2022-01-11 DIAGNOSIS — Z191 Hormone sensitive malignancy status: Secondary | ICD-10-CM | POA: Diagnosis not present

## 2022-01-11 DIAGNOSIS — C61 Malignant neoplasm of prostate: Secondary | ICD-10-CM | POA: Diagnosis not present

## 2022-01-11 DIAGNOSIS — Z510 Encounter for antineoplastic radiation therapy: Secondary | ICD-10-CM | POA: Diagnosis not present

## 2022-01-11 LAB — RAD ONC ARIA SESSION SUMMARY
Course Elapsed Days: 1
Plan Fractions Treated to Date: 2
Plan Prescribed Dose Per Fraction: 2.5 Gy
Plan Total Fractions Prescribed: 28
Plan Total Prescribed Dose: 70 Gy
Reference Point Dosage Given to Date: 5 Gy
Reference Point Session Dosage Given: 2.5 Gy
Session Number: 2

## 2022-01-12 ENCOUNTER — Other Ambulatory Visit: Payer: Self-pay

## 2022-01-12 ENCOUNTER — Ambulatory Visit
Admission: RE | Admit: 2022-01-12 | Discharge: 2022-01-12 | Disposition: A | Discharge status: Home / Self Care | Payer: Medicare HMO | Source: Ambulatory Visit | Attending: Radiation Oncology | Admitting: Radiation Oncology

## 2022-01-12 DIAGNOSIS — C61 Malignant neoplasm of prostate: Secondary | ICD-10-CM | POA: Diagnosis not present

## 2022-01-12 DIAGNOSIS — Z510 Encounter for antineoplastic radiation therapy: Secondary | ICD-10-CM | POA: Diagnosis not present

## 2022-01-12 DIAGNOSIS — Z191 Hormone sensitive malignancy status: Secondary | ICD-10-CM | POA: Diagnosis not present

## 2022-01-12 LAB — RAD ONC ARIA SESSION SUMMARY
Course Elapsed Days: 2
Plan Fractions Treated to Date: 3
Plan Prescribed Dose Per Fraction: 2.5 Gy
Plan Total Fractions Prescribed: 28
Plan Total Prescribed Dose: 70 Gy
Reference Point Dosage Given to Date: 7.5 Gy
Reference Point Session Dosage Given: 2.5 Gy
Session Number: 3

## 2022-01-13 ENCOUNTER — Other Ambulatory Visit: Payer: Self-pay

## 2022-01-13 ENCOUNTER — Ambulatory Visit
Admission: RE | Admit: 2022-01-13 | Discharge: 2022-01-13 | Disposition: A | Discharge status: Home / Self Care | Payer: Medicare HMO | Source: Ambulatory Visit | Attending: Radiation Oncology | Admitting: Radiation Oncology

## 2022-01-13 DIAGNOSIS — Z191 Hormone sensitive malignancy status: Secondary | ICD-10-CM | POA: Diagnosis not present

## 2022-01-13 DIAGNOSIS — C61 Malignant neoplasm of prostate: Secondary | ICD-10-CM | POA: Diagnosis not present

## 2022-01-13 DIAGNOSIS — Z510 Encounter for antineoplastic radiation therapy: Secondary | ICD-10-CM | POA: Diagnosis not present

## 2022-01-13 LAB — RAD ONC ARIA SESSION SUMMARY
Course Elapsed Days: 3
Plan Fractions Treated to Date: 4
Plan Prescribed Dose Per Fraction: 2.5 Gy
Plan Total Fractions Prescribed: 28
Plan Total Prescribed Dose: 70 Gy
Reference Point Dosage Given to Date: 10 Gy
Reference Point Session Dosage Given: 2.5 Gy
Session Number: 4

## 2022-01-14 ENCOUNTER — Other Ambulatory Visit: Payer: Self-pay

## 2022-01-14 ENCOUNTER — Ambulatory Visit
Admission: RE | Admit: 2022-01-14 | Discharge: 2022-01-14 | Disposition: A | Discharge status: Home / Self Care | Payer: Medicare HMO | Source: Ambulatory Visit | Attending: Radiation Oncology | Admitting: Radiation Oncology

## 2022-01-14 DIAGNOSIS — C61 Malignant neoplasm of prostate: Secondary | ICD-10-CM | POA: Diagnosis not present

## 2022-01-14 DIAGNOSIS — Z191 Hormone sensitive malignancy status: Secondary | ICD-10-CM | POA: Diagnosis not present

## 2022-01-14 DIAGNOSIS — Z510 Encounter for antineoplastic radiation therapy: Secondary | ICD-10-CM | POA: Diagnosis not present

## 2022-01-14 LAB — RAD ONC ARIA SESSION SUMMARY
Course Elapsed Days: 4
Plan Fractions Treated to Date: 5
Plan Prescribed Dose Per Fraction: 2.5 Gy
Plan Total Fractions Prescribed: 28
Plan Total Prescribed Dose: 70 Gy
Reference Point Dosage Given to Date: 12.5 Gy
Reference Point Session Dosage Given: 2.5 Gy
Session Number: 5

## 2022-01-14 NOTE — Progress Notes (Signed)
Pt here for patient teaching. Pt given Radiation and You booklet and skin care instructions.  Reviewed areas of pertinence such as diarrhea, fatigue, hair loss, nausea and vomiting, sexual and fertility changes, skin changes, urinary and bladder changes, earaches, and taste changes. Pt able to give teach back of to pat skin, use unscented/gentle soap, use baby wipes, have Imodium on hand, drink plenty of water, and sitz bath, avoid applying anything to skin within 4 hours of treatment and to use an electric razor if they must shave. Pt verbalizes understanding of information given and will contact nursing with any questions or concerns.   ? ? [email or web address] ? ? ? ? ? ?

## 2022-01-17 ENCOUNTER — Ambulatory Visit
Admission: RE | Admit: 2022-01-17 | Discharge: 2022-01-17 | Disposition: A | Discharge status: Home / Self Care | Payer: Medicare HMO | Source: Ambulatory Visit | Attending: Radiation Oncology | Admitting: Radiation Oncology

## 2022-01-17 ENCOUNTER — Other Ambulatory Visit: Payer: Self-pay

## 2022-01-17 DIAGNOSIS — Z191 Hormone sensitive malignancy status: Secondary | ICD-10-CM | POA: Diagnosis not present

## 2022-01-17 DIAGNOSIS — C61 Malignant neoplasm of prostate: Secondary | ICD-10-CM | POA: Diagnosis not present

## 2022-01-17 DIAGNOSIS — Z510 Encounter for antineoplastic radiation therapy: Secondary | ICD-10-CM | POA: Diagnosis not present

## 2022-01-17 LAB — RAD ONC ARIA SESSION SUMMARY
Course Elapsed Days: 7
Plan Fractions Treated to Date: 6
Plan Prescribed Dose Per Fraction: 2.5 Gy
Plan Total Fractions Prescribed: 28
Plan Total Prescribed Dose: 70 Gy
Reference Point Dosage Given to Date: 15 Gy
Reference Point Session Dosage Given: 2.5 Gy
Session Number: 6

## 2022-01-18 ENCOUNTER — Ambulatory Visit
Admission: RE | Admit: 2022-01-18 | Discharge: 2022-01-18 | Disposition: A | Discharge status: Home / Self Care | Payer: Medicare HMO | Source: Ambulatory Visit | Attending: Radiation Oncology | Admitting: Radiation Oncology

## 2022-01-18 ENCOUNTER — Other Ambulatory Visit: Payer: Self-pay

## 2022-01-18 DIAGNOSIS — Z191 Hormone sensitive malignancy status: Secondary | ICD-10-CM | POA: Diagnosis not present

## 2022-01-18 DIAGNOSIS — C61 Malignant neoplasm of prostate: Secondary | ICD-10-CM | POA: Diagnosis not present

## 2022-01-18 DIAGNOSIS — Z510 Encounter for antineoplastic radiation therapy: Secondary | ICD-10-CM | POA: Diagnosis not present

## 2022-01-18 LAB — RAD ONC ARIA SESSION SUMMARY
Course Elapsed Days: 8
Plan Fractions Treated to Date: 7
Plan Prescribed Dose Per Fraction: 2.5 Gy
Plan Total Fractions Prescribed: 28
Plan Total Prescribed Dose: 70 Gy
Reference Point Dosage Given to Date: 17.5 Gy
Reference Point Session Dosage Given: 2.5 Gy
Session Number: 7

## 2022-01-19 ENCOUNTER — Ambulatory Visit
Admission: RE | Admit: 2022-01-19 | Discharge: 2022-01-19 | Disposition: A | Discharge status: Home / Self Care | Payer: Medicare HMO | Source: Ambulatory Visit | Attending: Radiation Oncology | Admitting: Radiation Oncology

## 2022-01-19 ENCOUNTER — Other Ambulatory Visit: Payer: Self-pay

## 2022-01-19 DIAGNOSIS — Z191 Hormone sensitive malignancy status: Secondary | ICD-10-CM | POA: Diagnosis not present

## 2022-01-19 DIAGNOSIS — C61 Malignant neoplasm of prostate: Secondary | ICD-10-CM | POA: Diagnosis not present

## 2022-01-19 DIAGNOSIS — Z510 Encounter for antineoplastic radiation therapy: Secondary | ICD-10-CM | POA: Diagnosis not present

## 2022-01-19 LAB — RAD ONC ARIA SESSION SUMMARY
Course Elapsed Days: 9
Plan Fractions Treated to Date: 8
Plan Prescribed Dose Per Fraction: 2.5 Gy
Plan Total Fractions Prescribed: 28
Plan Total Prescribed Dose: 70 Gy
Reference Point Dosage Given to Date: 20 Gy
Reference Point Session Dosage Given: 2.5 Gy
Session Number: 8

## 2022-01-20 ENCOUNTER — Ambulatory Visit
Admission: RE | Admit: 2022-01-20 | Discharge: 2022-01-20 | Disposition: A | Discharge status: Home / Self Care | Payer: Medicare HMO | Source: Ambulatory Visit | Attending: Radiation Oncology | Admitting: Radiation Oncology

## 2022-01-20 ENCOUNTER — Other Ambulatory Visit: Payer: Self-pay

## 2022-01-20 DIAGNOSIS — Z510 Encounter for antineoplastic radiation therapy: Secondary | ICD-10-CM | POA: Diagnosis not present

## 2022-01-20 DIAGNOSIS — C61 Malignant neoplasm of prostate: Secondary | ICD-10-CM | POA: Diagnosis not present

## 2022-01-20 DIAGNOSIS — Z191 Hormone sensitive malignancy status: Secondary | ICD-10-CM | POA: Diagnosis not present

## 2022-01-20 LAB — RAD ONC ARIA SESSION SUMMARY
Course Elapsed Days: 10
Plan Fractions Treated to Date: 9
Plan Prescribed Dose Per Fraction: 2.5 Gy
Plan Total Fractions Prescribed: 28
Plan Total Prescribed Dose: 70 Gy
Reference Point Dosage Given to Date: 22.5 Gy
Reference Point Session Dosage Given: 2.5 Gy
Session Number: 9

## 2022-01-21 ENCOUNTER — Other Ambulatory Visit: Payer: Self-pay

## 2022-01-21 ENCOUNTER — Ambulatory Visit
Admission: RE | Admit: 2022-01-21 | Discharge: 2022-01-21 | Disposition: A | Discharge status: Home / Self Care | Payer: Medicare HMO | Source: Ambulatory Visit | Attending: Radiation Oncology | Admitting: Radiation Oncology

## 2022-01-21 DIAGNOSIS — Z510 Encounter for antineoplastic radiation therapy: Secondary | ICD-10-CM | POA: Diagnosis not present

## 2022-01-21 DIAGNOSIS — Z191 Hormone sensitive malignancy status: Secondary | ICD-10-CM | POA: Diagnosis not present

## 2022-01-21 DIAGNOSIS — C61 Malignant neoplasm of prostate: Secondary | ICD-10-CM | POA: Diagnosis not present

## 2022-01-21 LAB — RAD ONC ARIA SESSION SUMMARY
Course Elapsed Days: 11
Plan Fractions Treated to Date: 10
Plan Prescribed Dose Per Fraction: 2.5 Gy
Plan Total Fractions Prescribed: 28
Plan Total Prescribed Dose: 70 Gy
Reference Point Dosage Given to Date: 25 Gy
Reference Point Session Dosage Given: 2.5 Gy
Session Number: 10

## 2022-01-24 ENCOUNTER — Other Ambulatory Visit: Payer: Self-pay

## 2022-01-24 ENCOUNTER — Ambulatory Visit
Admission: RE | Admit: 2022-01-24 | Discharge: 2022-01-24 | Disposition: A | Discharge status: Home / Self Care | Payer: Medicare HMO | Source: Ambulatory Visit | Attending: Radiation Oncology | Admitting: Radiation Oncology

## 2022-01-24 DIAGNOSIS — Z191 Hormone sensitive malignancy status: Secondary | ICD-10-CM | POA: Diagnosis not present

## 2022-01-24 DIAGNOSIS — C61 Malignant neoplasm of prostate: Secondary | ICD-10-CM | POA: Diagnosis not present

## 2022-01-24 DIAGNOSIS — Z510 Encounter for antineoplastic radiation therapy: Secondary | ICD-10-CM | POA: Insufficient documentation

## 2022-01-24 LAB — RAD ONC ARIA SESSION SUMMARY
Course Elapsed Days: 14
Plan Fractions Treated to Date: 11
Plan Prescribed Dose Per Fraction: 2.5 Gy
Plan Total Fractions Prescribed: 28
Plan Total Prescribed Dose: 70 Gy
Reference Point Dosage Given to Date: 27.5 Gy
Reference Point Session Dosage Given: 2.5 Gy
Session Number: 11

## 2022-01-25 ENCOUNTER — Ambulatory Visit
Admission: RE | Admit: 2022-01-25 | Discharge: 2022-01-25 | Disposition: A | Discharge status: Home / Self Care | Payer: Medicare HMO | Source: Ambulatory Visit | Attending: Radiation Oncology | Admitting: Radiation Oncology

## 2022-01-25 ENCOUNTER — Other Ambulatory Visit: Payer: Self-pay

## 2022-01-25 DIAGNOSIS — C61 Malignant neoplasm of prostate: Secondary | ICD-10-CM | POA: Diagnosis not present

## 2022-01-25 DIAGNOSIS — Z191 Hormone sensitive malignancy status: Secondary | ICD-10-CM | POA: Diagnosis not present

## 2022-01-25 DIAGNOSIS — Z510 Encounter for antineoplastic radiation therapy: Secondary | ICD-10-CM | POA: Diagnosis not present

## 2022-01-25 LAB — RAD ONC ARIA SESSION SUMMARY
Course Elapsed Days: 15
Plan Fractions Treated to Date: 12
Plan Prescribed Dose Per Fraction: 2.5 Gy
Plan Total Fractions Prescribed: 28
Plan Total Prescribed Dose: 70 Gy
Reference Point Dosage Given to Date: 30 Gy
Reference Point Session Dosage Given: 2.5 Gy
Session Number: 12

## 2022-01-26 ENCOUNTER — Other Ambulatory Visit: Payer: Self-pay

## 2022-01-26 ENCOUNTER — Ambulatory Visit
Admission: RE | Admit: 2022-01-26 | Discharge: 2022-01-26 | Disposition: A | Discharge status: Home / Self Care | Payer: Medicare HMO | Source: Ambulatory Visit | Attending: Radiation Oncology | Admitting: Radiation Oncology

## 2022-01-26 DIAGNOSIS — C61 Malignant neoplasm of prostate: Secondary | ICD-10-CM | POA: Diagnosis not present

## 2022-01-26 DIAGNOSIS — Z191 Hormone sensitive malignancy status: Secondary | ICD-10-CM | POA: Diagnosis not present

## 2022-01-26 DIAGNOSIS — Z510 Encounter for antineoplastic radiation therapy: Secondary | ICD-10-CM | POA: Diagnosis not present

## 2022-01-26 LAB — RAD ONC ARIA SESSION SUMMARY
Course Elapsed Days: 16
Plan Fractions Treated to Date: 13
Plan Prescribed Dose Per Fraction: 2.5 Gy
Plan Total Fractions Prescribed: 28
Plan Total Prescribed Dose: 70 Gy
Reference Point Dosage Given to Date: 32.5 Gy
Reference Point Session Dosage Given: 2.5 Gy
Session Number: 13

## 2022-01-27 ENCOUNTER — Ambulatory Visit
Admission: RE | Admit: 2022-01-27 | Discharge: 2022-01-27 | Disposition: A | Discharge status: Home / Self Care | Payer: Medicare HMO | Source: Ambulatory Visit | Attending: Radiation Oncology | Admitting: Radiation Oncology

## 2022-01-27 ENCOUNTER — Other Ambulatory Visit: Payer: Self-pay

## 2022-01-27 DIAGNOSIS — C61 Malignant neoplasm of prostate: Secondary | ICD-10-CM | POA: Diagnosis not present

## 2022-01-27 DIAGNOSIS — Z510 Encounter for antineoplastic radiation therapy: Secondary | ICD-10-CM | POA: Diagnosis not present

## 2022-01-27 DIAGNOSIS — Z191 Hormone sensitive malignancy status: Secondary | ICD-10-CM | POA: Diagnosis not present

## 2022-01-27 LAB — RAD ONC ARIA SESSION SUMMARY
Course Elapsed Days: 17
Plan Fractions Treated to Date: 14
Plan Prescribed Dose Per Fraction: 2.5 Gy
Plan Total Fractions Prescribed: 28
Plan Total Prescribed Dose: 70 Gy
Reference Point Dosage Given to Date: 35 Gy
Reference Point Session Dosage Given: 2.5 Gy
Session Number: 14

## 2022-01-28 ENCOUNTER — Other Ambulatory Visit: Payer: Self-pay

## 2022-01-28 ENCOUNTER — Ambulatory Visit
Admission: RE | Admit: 2022-01-28 | Discharge: 2022-01-28 | Disposition: A | Discharge status: Home / Self Care | Payer: Medicare HMO | Source: Ambulatory Visit | Attending: Radiation Oncology | Admitting: Radiation Oncology

## 2022-01-28 DIAGNOSIS — Z510 Encounter for antineoplastic radiation therapy: Secondary | ICD-10-CM | POA: Diagnosis not present

## 2022-01-28 DIAGNOSIS — Z191 Hormone sensitive malignancy status: Secondary | ICD-10-CM | POA: Diagnosis not present

## 2022-01-28 DIAGNOSIS — C61 Malignant neoplasm of prostate: Secondary | ICD-10-CM | POA: Diagnosis not present

## 2022-01-28 LAB — RAD ONC ARIA SESSION SUMMARY
Course Elapsed Days: 18
Plan Fractions Treated to Date: 15
Plan Prescribed Dose Per Fraction: 2.5 Gy
Plan Total Fractions Prescribed: 28
Plan Total Prescribed Dose: 70 Gy
Reference Point Dosage Given to Date: 37.5 Gy
Reference Point Session Dosage Given: 2.5 Gy
Session Number: 15

## 2022-01-31 ENCOUNTER — Other Ambulatory Visit: Payer: Self-pay

## 2022-01-31 ENCOUNTER — Ambulatory Visit
Admission: RE | Admit: 2022-01-31 | Discharge: 2022-01-31 | Disposition: A | Discharge status: Home / Self Care | Payer: Medicare HMO | Source: Ambulatory Visit | Attending: Radiation Oncology | Admitting: Radiation Oncology

## 2022-01-31 DIAGNOSIS — Z191 Hormone sensitive malignancy status: Secondary | ICD-10-CM | POA: Diagnosis not present

## 2022-01-31 DIAGNOSIS — Z510 Encounter for antineoplastic radiation therapy: Secondary | ICD-10-CM | POA: Diagnosis not present

## 2022-01-31 DIAGNOSIS — C61 Malignant neoplasm of prostate: Secondary | ICD-10-CM | POA: Diagnosis not present

## 2022-01-31 LAB — RAD ONC ARIA SESSION SUMMARY
Course Elapsed Days: 21
Plan Fractions Treated to Date: 16
Plan Prescribed Dose Per Fraction: 2.5 Gy
Plan Total Fractions Prescribed: 28
Plan Total Prescribed Dose: 70 Gy
Reference Point Dosage Given to Date: 40 Gy
Reference Point Session Dosage Given: 2.5 Gy
Session Number: 16

## 2022-02-01 ENCOUNTER — Ambulatory Visit
Admission: RE | Admit: 2022-02-01 | Discharge: 2022-02-01 | Disposition: A | Discharge status: Home / Self Care | Payer: Medicare HMO | Source: Ambulatory Visit | Attending: Radiation Oncology | Admitting: Radiation Oncology

## 2022-02-01 ENCOUNTER — Other Ambulatory Visit: Payer: Self-pay

## 2022-02-01 DIAGNOSIS — C61 Malignant neoplasm of prostate: Secondary | ICD-10-CM | POA: Diagnosis not present

## 2022-02-01 DIAGNOSIS — Z510 Encounter for antineoplastic radiation therapy: Secondary | ICD-10-CM | POA: Diagnosis not present

## 2022-02-01 DIAGNOSIS — Z191 Hormone sensitive malignancy status: Secondary | ICD-10-CM | POA: Diagnosis not present

## 2022-02-01 DIAGNOSIS — I2510 Atherosclerotic heart disease of native coronary artery without angina pectoris: Secondary | ICD-10-CM | POA: Diagnosis not present

## 2022-02-01 LAB — RAD ONC ARIA SESSION SUMMARY
Course Elapsed Days: 22
Plan Fractions Treated to Date: 17
Plan Prescribed Dose Per Fraction: 2.5 Gy
Plan Total Fractions Prescribed: 28
Plan Total Prescribed Dose: 70 Gy
Reference Point Dosage Given to Date: 42.5 Gy
Reference Point Session Dosage Given: 2.5 Gy
Session Number: 17

## 2022-02-02 ENCOUNTER — Other Ambulatory Visit: Payer: Self-pay

## 2022-02-02 ENCOUNTER — Ambulatory Visit
Admission: RE | Admit: 2022-02-02 | Discharge: 2022-02-02 | Disposition: A | Discharge status: Home / Self Care | Payer: Medicare HMO | Source: Ambulatory Visit | Attending: Radiation Oncology | Admitting: Radiation Oncology

## 2022-02-02 DIAGNOSIS — C61 Malignant neoplasm of prostate: Secondary | ICD-10-CM | POA: Diagnosis not present

## 2022-02-02 DIAGNOSIS — T466X5A Adverse effect of antihyperlipidemic and antiarteriosclerotic drugs, initial encounter: Secondary | ICD-10-CM | POA: Diagnosis not present

## 2022-02-02 DIAGNOSIS — E7800 Pure hypercholesterolemia, unspecified: Secondary | ICD-10-CM | POA: Diagnosis not present

## 2022-02-02 DIAGNOSIS — M791 Myalgia, unspecified site: Secondary | ICD-10-CM | POA: Diagnosis not present

## 2022-02-02 DIAGNOSIS — Z789 Other specified health status: Secondary | ICD-10-CM | POA: Diagnosis not present

## 2022-02-02 DIAGNOSIS — Z191 Hormone sensitive malignancy status: Secondary | ICD-10-CM | POA: Diagnosis not present

## 2022-02-02 DIAGNOSIS — I2510 Atherosclerotic heart disease of native coronary artery without angina pectoris: Secondary | ICD-10-CM | POA: Diagnosis not present

## 2022-02-02 DIAGNOSIS — Z510 Encounter for antineoplastic radiation therapy: Secondary | ICD-10-CM | POA: Diagnosis not present

## 2022-02-02 LAB — RAD ONC ARIA SESSION SUMMARY
Course Elapsed Days: 23
Plan Fractions Treated to Date: 18
Plan Prescribed Dose Per Fraction: 2.5 Gy
Plan Total Fractions Prescribed: 28
Plan Total Prescribed Dose: 70 Gy
Reference Point Dosage Given to Date: 45 Gy
Reference Point Session Dosage Given: 2.5 Gy
Session Number: 18

## 2022-02-03 ENCOUNTER — Other Ambulatory Visit: Payer: Self-pay

## 2022-02-03 ENCOUNTER — Ambulatory Visit
Admission: RE | Admit: 2022-02-03 | Discharge: 2022-02-03 | Disposition: A | Discharge status: Home / Self Care | Payer: Medicare HMO | Source: Ambulatory Visit | Attending: Radiation Oncology | Admitting: Radiation Oncology

## 2022-02-03 DIAGNOSIS — Z510 Encounter for antineoplastic radiation therapy: Secondary | ICD-10-CM | POA: Diagnosis not present

## 2022-02-03 DIAGNOSIS — C61 Malignant neoplasm of prostate: Secondary | ICD-10-CM | POA: Diagnosis not present

## 2022-02-03 DIAGNOSIS — Z191 Hormone sensitive malignancy status: Secondary | ICD-10-CM | POA: Diagnosis not present

## 2022-02-03 LAB — RAD ONC ARIA SESSION SUMMARY
Course Elapsed Days: 24
Plan Fractions Treated to Date: 19
Plan Prescribed Dose Per Fraction: 2.5 Gy
Plan Total Fractions Prescribed: 28
Plan Total Prescribed Dose: 70 Gy
Reference Point Dosage Given to Date: 47.5 Gy
Reference Point Session Dosage Given: 2.5 Gy
Session Number: 19

## 2022-02-04 ENCOUNTER — Other Ambulatory Visit: Payer: Self-pay

## 2022-02-04 ENCOUNTER — Ambulatory Visit
Admission: RE | Admit: 2022-02-04 | Discharge: 2022-02-04 | Disposition: A | Discharge status: Home / Self Care | Payer: Medicare HMO | Source: Ambulatory Visit | Attending: Radiation Oncology | Admitting: Radiation Oncology

## 2022-02-04 DIAGNOSIS — Z510 Encounter for antineoplastic radiation therapy: Secondary | ICD-10-CM | POA: Diagnosis not present

## 2022-02-04 DIAGNOSIS — Z191 Hormone sensitive malignancy status: Secondary | ICD-10-CM | POA: Diagnosis not present

## 2022-02-04 DIAGNOSIS — C61 Malignant neoplasm of prostate: Secondary | ICD-10-CM | POA: Diagnosis not present

## 2022-02-04 LAB — RAD ONC ARIA SESSION SUMMARY
Course Elapsed Days: 25
Plan Fractions Treated to Date: 20
Plan Prescribed Dose Per Fraction: 2.5 Gy
Plan Total Fractions Prescribed: 28
Plan Total Prescribed Dose: 70 Gy
Reference Point Dosage Given to Date: 50 Gy
Reference Point Session Dosage Given: 2.5 Gy
Session Number: 20

## 2022-02-07 ENCOUNTER — Other Ambulatory Visit: Payer: Self-pay

## 2022-02-07 ENCOUNTER — Ambulatory Visit
Admission: RE | Admit: 2022-02-07 | Discharge: 2022-02-07 | Disposition: A | Discharge status: Home / Self Care | Payer: Medicare HMO | Source: Ambulatory Visit | Attending: Radiation Oncology | Admitting: Radiation Oncology

## 2022-02-07 DIAGNOSIS — Z191 Hormone sensitive malignancy status: Secondary | ICD-10-CM | POA: Diagnosis not present

## 2022-02-07 DIAGNOSIS — C61 Malignant neoplasm of prostate: Secondary | ICD-10-CM | POA: Diagnosis not present

## 2022-02-07 DIAGNOSIS — Z510 Encounter for antineoplastic radiation therapy: Secondary | ICD-10-CM | POA: Diagnosis not present

## 2022-02-07 LAB — RAD ONC ARIA SESSION SUMMARY
Course Elapsed Days: 28
Plan Fractions Treated to Date: 21
Plan Prescribed Dose Per Fraction: 2.5 Gy
Plan Total Fractions Prescribed: 28
Plan Total Prescribed Dose: 70 Gy
Reference Point Dosage Given to Date: 52.5 Gy
Reference Point Session Dosage Given: 2.5 Gy
Session Number: 21

## 2022-02-08 ENCOUNTER — Ambulatory Visit
Admission: RE | Admit: 2022-02-08 | Discharge: 2022-02-08 | Disposition: A | Discharge status: Home / Self Care | Payer: Medicare HMO | Source: Ambulatory Visit | Attending: Radiation Oncology | Admitting: Radiation Oncology

## 2022-02-08 ENCOUNTER — Other Ambulatory Visit: Payer: Self-pay

## 2022-02-08 DIAGNOSIS — Z191 Hormone sensitive malignancy status: Secondary | ICD-10-CM | POA: Diagnosis not present

## 2022-02-08 DIAGNOSIS — C61 Malignant neoplasm of prostate: Secondary | ICD-10-CM | POA: Diagnosis not present

## 2022-02-08 DIAGNOSIS — Z510 Encounter for antineoplastic radiation therapy: Secondary | ICD-10-CM | POA: Diagnosis not present

## 2022-02-08 LAB — RAD ONC ARIA SESSION SUMMARY
Course Elapsed Days: 29
Plan Fractions Treated to Date: 22
Plan Prescribed Dose Per Fraction: 2.5 Gy
Plan Total Fractions Prescribed: 28
Plan Total Prescribed Dose: 70 Gy
Reference Point Dosage Given to Date: 55 Gy
Reference Point Session Dosage Given: 2.5 Gy
Session Number: 22

## 2022-02-09 ENCOUNTER — Ambulatory Visit
Admission: RE | Admit: 2022-02-09 | Discharge: 2022-02-09 | Disposition: A | Discharge status: Home / Self Care | Payer: Medicare HMO | Source: Ambulatory Visit | Attending: Radiation Oncology | Admitting: Radiation Oncology

## 2022-02-09 ENCOUNTER — Other Ambulatory Visit: Payer: Self-pay

## 2022-02-09 DIAGNOSIS — C61 Malignant neoplasm of prostate: Secondary | ICD-10-CM | POA: Diagnosis not present

## 2022-02-09 DIAGNOSIS — Z510 Encounter for antineoplastic radiation therapy: Secondary | ICD-10-CM | POA: Diagnosis not present

## 2022-02-09 DIAGNOSIS — Z191 Hormone sensitive malignancy status: Secondary | ICD-10-CM | POA: Diagnosis not present

## 2022-02-09 LAB — RAD ONC ARIA SESSION SUMMARY
Course Elapsed Days: 30
Plan Fractions Treated to Date: 23
Plan Prescribed Dose Per Fraction: 2.5 Gy
Plan Total Fractions Prescribed: 28
Plan Total Prescribed Dose: 70 Gy
Reference Point Dosage Given to Date: 57.5 Gy
Reference Point Session Dosage Given: 2.5 Gy
Session Number: 23

## 2022-02-10 ENCOUNTER — Other Ambulatory Visit: Payer: Self-pay

## 2022-02-10 ENCOUNTER — Ambulatory Visit
Admission: RE | Admit: 2022-02-10 | Discharge: 2022-02-10 | Disposition: A | Discharge status: Home / Self Care | Payer: Medicare HMO | Source: Ambulatory Visit | Attending: Radiation Oncology | Admitting: Radiation Oncology

## 2022-02-10 DIAGNOSIS — C61 Malignant neoplasm of prostate: Secondary | ICD-10-CM | POA: Diagnosis not present

## 2022-02-10 DIAGNOSIS — Z510 Encounter for antineoplastic radiation therapy: Secondary | ICD-10-CM | POA: Diagnosis not present

## 2022-02-10 DIAGNOSIS — Z191 Hormone sensitive malignancy status: Secondary | ICD-10-CM | POA: Diagnosis not present

## 2022-02-10 LAB — RAD ONC ARIA SESSION SUMMARY
Course Elapsed Days: 31
Plan Fractions Treated to Date: 24
Plan Prescribed Dose Per Fraction: 2.5 Gy
Plan Total Fractions Prescribed: 28
Plan Total Prescribed Dose: 70 Gy
Reference Point Dosage Given to Date: 60 Gy
Reference Point Session Dosage Given: 2.5 Gy
Session Number: 24

## 2022-02-11 ENCOUNTER — Other Ambulatory Visit: Payer: Self-pay

## 2022-02-11 ENCOUNTER — Ambulatory Visit
Admission: RE | Admit: 2022-02-11 | Discharge: 2022-02-11 | Disposition: A | Discharge status: Home / Self Care | Payer: Medicare HMO | Source: Ambulatory Visit | Attending: Radiation Oncology | Admitting: Radiation Oncology

## 2022-02-11 DIAGNOSIS — Z191 Hormone sensitive malignancy status: Secondary | ICD-10-CM | POA: Diagnosis not present

## 2022-02-11 DIAGNOSIS — Z510 Encounter for antineoplastic radiation therapy: Secondary | ICD-10-CM | POA: Diagnosis not present

## 2022-02-11 DIAGNOSIS — C61 Malignant neoplasm of prostate: Secondary | ICD-10-CM | POA: Diagnosis not present

## 2022-02-11 LAB — RAD ONC ARIA SESSION SUMMARY
Course Elapsed Days: 32
Plan Fractions Treated to Date: 25
Plan Prescribed Dose Per Fraction: 2.5 Gy
Plan Total Fractions Prescribed: 28
Plan Total Prescribed Dose: 70 Gy
Reference Point Dosage Given to Date: 62.5 Gy
Reference Point Session Dosage Given: 2.5 Gy
Session Number: 25

## 2022-02-14 ENCOUNTER — Other Ambulatory Visit: Payer: Self-pay

## 2022-02-14 ENCOUNTER — Ambulatory Visit
Admission: RE | Admit: 2022-02-14 | Discharge: 2022-02-14 | Disposition: A | Discharge status: Home / Self Care | Payer: Medicare HMO | Source: Ambulatory Visit | Attending: Radiation Oncology | Admitting: Radiation Oncology

## 2022-02-14 DIAGNOSIS — C61 Malignant neoplasm of prostate: Secondary | ICD-10-CM | POA: Diagnosis not present

## 2022-02-14 DIAGNOSIS — Z191 Hormone sensitive malignancy status: Secondary | ICD-10-CM | POA: Diagnosis not present

## 2022-02-14 DIAGNOSIS — Z510 Encounter for antineoplastic radiation therapy: Secondary | ICD-10-CM | POA: Diagnosis not present

## 2022-02-14 LAB — RAD ONC ARIA SESSION SUMMARY
Course Elapsed Days: 35
Plan Fractions Treated to Date: 26
Plan Prescribed Dose Per Fraction: 2.5 Gy
Plan Total Fractions Prescribed: 28
Plan Total Prescribed Dose: 70 Gy
Reference Point Dosage Given to Date: 65 Gy
Reference Point Session Dosage Given: 2.5 Gy
Session Number: 26

## 2022-02-15 ENCOUNTER — Other Ambulatory Visit: Payer: Self-pay

## 2022-02-15 ENCOUNTER — Other Ambulatory Visit: Payer: Self-pay | Admitting: Radiation Oncology

## 2022-02-15 ENCOUNTER — Ambulatory Visit
Admission: RE | Admit: 2022-02-15 | Discharge: 2022-02-15 | Disposition: A | Discharge status: Home / Self Care | Payer: Medicare HMO | Source: Ambulatory Visit | Attending: Radiation Oncology | Admitting: Radiation Oncology

## 2022-02-15 ENCOUNTER — Encounter: Payer: Self-pay | Admitting: Gastroenterology

## 2022-02-15 DIAGNOSIS — Z510 Encounter for antineoplastic radiation therapy: Secondary | ICD-10-CM | POA: Diagnosis not present

## 2022-02-15 DIAGNOSIS — Z191 Hormone sensitive malignancy status: Secondary | ICD-10-CM | POA: Diagnosis not present

## 2022-02-15 DIAGNOSIS — C61 Malignant neoplasm of prostate: Secondary | ICD-10-CM | POA: Diagnosis not present

## 2022-02-15 LAB — RAD ONC ARIA SESSION SUMMARY
Course Elapsed Days: 36
Plan Fractions Treated to Date: 27
Plan Prescribed Dose Per Fraction: 2.5 Gy
Plan Total Fractions Prescribed: 28
Plan Total Prescribed Dose: 70 Gy
Reference Point Dosage Given to Date: 67.5 Gy
Reference Point Session Dosage Given: 2.5 Gy
Session Number: 27

## 2022-02-15 MED ORDER — TAMSULOSIN HCL 0.4 MG PO CAPS: 0.4000 mg | ORAL_CAPSULE | Freq: Every day | ORAL | 5 refills | Status: DC

## 2022-02-16 ENCOUNTER — Other Ambulatory Visit: Payer: Self-pay

## 2022-02-16 ENCOUNTER — Encounter: Payer: Self-pay | Admitting: Urology

## 2022-02-16 ENCOUNTER — Ambulatory Visit
Admission: RE | Admit: 2022-02-16 | Discharge: 2022-02-16 | Disposition: A | Discharge status: Home / Self Care | Payer: Medicare HMO | Source: Ambulatory Visit | Attending: Radiation Oncology | Admitting: Radiation Oncology

## 2022-02-16 DIAGNOSIS — Z191 Hormone sensitive malignancy status: Secondary | ICD-10-CM | POA: Diagnosis not present

## 2022-02-16 DIAGNOSIS — C61 Malignant neoplasm of prostate: Secondary | ICD-10-CM | POA: Diagnosis not present

## 2022-02-16 DIAGNOSIS — Z510 Encounter for antineoplastic radiation therapy: Secondary | ICD-10-CM | POA: Diagnosis not present

## 2022-02-16 LAB — RAD ONC ARIA SESSION SUMMARY
Course Elapsed Days: 37
Plan Fractions Treated to Date: 28
Plan Prescribed Dose Per Fraction: 2.5 Gy
Plan Total Fractions Prescribed: 28
Plan Total Prescribed Dose: 70 Gy
Reference Point Dosage Given to Date: 70 Gy
Reference Point Session Dosage Given: 2.5 Gy
Session Number: 28

## 2022-02-17 ENCOUNTER — Encounter: Payer: Self-pay | Admitting: Podiatry

## 2022-02-17 ENCOUNTER — Ambulatory Visit (INDEPENDENT_AMBULATORY_CARE_PROVIDER_SITE_OTHER): Payer: Medicare HMO

## 2022-02-17 ENCOUNTER — Ambulatory Visit: Payer: Medicare HMO | Admitting: Podiatry

## 2022-02-17 DIAGNOSIS — M722 Plantar fascial fibromatosis: Secondary | ICD-10-CM

## 2022-02-17 DIAGNOSIS — M205X1 Other deformities of toe(s) (acquired), right foot: Secondary | ICD-10-CM | POA: Diagnosis not present

## 2022-02-17 MED ORDER — TRIAMCINOLONE ACETONIDE 10 MG/ML IJ SUSP
10.0000 mg | Freq: Once | INTRAMUSCULAR | Status: AC
  Administered 2022-02-17: 10 mg

## 2022-02-17 NOTE — Patient Instructions (Signed)

## 2022-02-17 NOTE — Progress Notes (Signed)
Subjective:   Patient ID: Henry [name], male   DOB: 76 y.o.   MRN: [id number]   HPI Patient presents stating has developed a lot of pain in the right heel that is been present for a while but worse over the last couple months and it sharp and aching especially when he tries to be active.  Does have some reduced motion of the big toe joint right and states the surgery did on the left is done well and he does not currently smoke likes to be active   Review of Systems  All other systems reviewed and are negative.      Objective:  Physical Exam Vitals and nursing note reviewed.  Constitutional:      Appearance: He is well-developed.  Pulmonary:     Effort: Pulmonary effort is normal.  Musculoskeletal:        General: Normal range of motion.  Skin:    General: Skin is warm.  Neurological:     Mental Status: He is alert.    Neurovascular status intact muscle strength found to be adequate range of motion adequate.  Patient is noted to have significant discomfort plantar aspect right heel at the insertional point of the tendon into the calcaneus with inflammation fluid of the medial band and is noted to have well-healed surgical site left first metatarsal mild range of motion loss right first MPJ.  Good digital perfusion well oriented x3     Assessment:  Acute plantar fasciitis right with inflammation fluid buildup along with hallux limitus deformity right with history of surgery left     Plan:  H&P x-rays right reviewed and I went ahead did sterile prep injected the fascia 3 mg Kenalog 5 mg Xylocaine applied fascial brace with instructions on usage along with good support shoes and reappoint for [place] to recheck  X-rays indicate spur formation no indication stress fracture arthritis

## 2022-03-03 ENCOUNTER — Ambulatory Visit (INDEPENDENT_AMBULATORY_CARE_PROVIDER_SITE_OTHER): Payer: Medicare HMO | Admitting: Podiatry

## 2022-03-03 ENCOUNTER — Encounter: Payer: Self-pay | Admitting: Podiatry

## 2022-03-03 DIAGNOSIS — B351 Tinea unguium: Secondary | ICD-10-CM

## 2022-03-03 DIAGNOSIS — M722 Plantar fascial fibromatosis: Secondary | ICD-10-CM | POA: Diagnosis not present

## 2022-03-03 DIAGNOSIS — M205X1 Other deformities of toe(s) (acquired), right foot: Secondary | ICD-10-CM | POA: Diagnosis not present

## 2022-03-03 NOTE — Progress Notes (Signed)
Subjective:   Patient ID: Henry [name], male   DOB: 76 y.o.   MRN: [id number]   HPI Patient presents stating he is improved with his heel stating that he does not have the same discomfort he did also concerned about some nail disease with discoloration and does have moderate range of motion loss first MPJ right over left   ROS      Objective:  Physical Exam  Improvement Planter fasciitis right pain still present upon deep palpation but better than it was with moderate hallux limitus and nail discoloration of the hallux nails distal one third bilateral     Assessment:  3 separate problems with improved Planter fasciitis with treatment chronic hallux limitus condition and nail disease with mycosis     Plan:  H&P reviewed condition and went ahead discussed the continuation of physical therapy for this anti-inflammatories and support with shoe gear modifications.  Hallux limitus may require procedure and I did explain to him the possibility for biplanar osteotomy at 1 point in future or joint implant fusion but we will hold off currently and nail disease he will use white vinegar as he uses it for his fingernails and I encouraged him to do that current

## 2022-03-30 ENCOUNTER — Telehealth: Payer: Self-pay

## 2022-03-30 DIAGNOSIS — D3131 Benign neoplasm of right choroid: Secondary | ICD-10-CM | POA: Diagnosis not present

## 2022-03-30 DIAGNOSIS — H2513 Age-related nuclear cataract, bilateral: Secondary | ICD-10-CM | POA: Diagnosis not present

## 2022-03-30 NOTE — Telephone Encounter (Signed)
Called patient x2. Left message reminder of patient's 10:30am-03/31/22 telephone appointment w/ Ashlyn Bruning PA-C. I left my extension [phone] and requested that patient return my call, in reference to completing the nursing portion of this appointment.

## 2022-03-30 NOTE — Progress Notes (Signed)
  Radiation Oncology         [phone]) [phone] ________________________________  Name: Henry [name] MRN: [id number]  Date: 02/16/2022  DOB: January 08, 1946  End of Treatment Note  Diagnosis:   76 y.o. gentleman with Stage T1c adenocarcinoma of the prostate with Gleason score of 3+4, and PSA of 4.88.     Indication for treatment:  Curative, Definitive Radiotherapy       Radiation treatment dates:   01/10/22 - 02/16/22  Site/dose:   The prostate was treated to 70 Gy in 28 fractions of 2.5 Gy  Beams/energy:   The patient was treated with IMRT using volumetric arc therapy delivering 6 MV X-rays to clockwise and counterclockwise circumferential arcs with a 90 degree collimator offset to avoid dose scalloping.  Image guidance was performed with daily cone beam CT prior to each fraction to align to gold markers in the prostate and assure proper bladder and rectal fill volumes.  Immobilization was achieved with BodyFix custom mold.  Narrative: The patient tolerated radiation treatment relatively well with only minor urinary irritation and modest fatigue.  He reported increased frequency, urgency, dysuria and nocturia. He also experienced constipation.  Plan: The patient has completed radiation treatment. He will return to radiation oncology clinic for routine followup in one month. I advised him to call or return sooner if he has any questions or concerns related to his recovery or treatment. ________________________________  Sheral Apley. Tammi Klippel, M.D.

## 2022-03-31 ENCOUNTER — Encounter: Payer: Self-pay | Admitting: Urology

## 2022-03-31 ENCOUNTER — Ambulatory Visit
Admission: RE | Admit: 2022-03-31 | Discharge: 2022-03-31 | Disposition: A | Discharge status: Home / Self Care | Payer: Medicare HMO | Source: Ambulatory Visit | Attending: Urology | Admitting: Urology

## 2022-03-31 DIAGNOSIS — C61 Malignant neoplasm of prostate: Secondary | ICD-10-CM

## 2022-03-31 NOTE — Progress Notes (Signed)
Radiation Oncology         [phone]) [phone] ________________________________  Name: Henry [name] MRN: [id number]  Date: 03/31/2022  DOB: [birth year]  Post Treatment Note  CC: Deland Pretty, MD  Franchot Gallo, MD  Diagnosis:    76 y.o. gentleman with Stage T1c adenocarcinoma of the prostate with Gleason score of 3+4, and PSA of 4.88.   Interval Since Last Radiation:  6 weeks  01/10/22 - 02/16/22:  The prostate was treated to 70 Gy in 28 fractions of 2.5 Gy  Narrative:  I spoke with the patient to conduct his routine scheduled 1 month follow up visit via telephone to spare the patient unnecessary potential exposure in the healthcare setting during the current COVID-19 pandemic.  The patient was notified in advance and gave permission to proceed with this visit format.  He tolerated radiation treatment relatively well with only minor urinary irritation and modest fatigue.  He reported increased frequency, urgency, dysuria and nocturia. He also experienced constipation.                              On review of systems, the patient states that he is doing well in general.  He has noticed gradual improvement in his LUTS and is basically back to his baseline at this point.  He is no longer having any issues with constipation and denies abdominal pain, nausea, vomiting or diarrhea.  He reports a healthy appetite and is maintaining his weight.  He has noticed some mild residual decrease in his stamina when exercising but has been able to remain active and is overall, quite pleased with his progress to date.  ALLERGIES:  is allergic to atorvastatin.  Meds: Current Outpatient Medications  Medication Sig Dispense Refill   acetaminophen (TYLENOL) 325 MG tablet Take 650 mg by mouth every 6 (six) hours as needed. Take 2 pills daily as needed     cetirizine (ZYRTEC) 10 MG tablet Take 10 mg by mouth daily as needed for allergies.     Cholecalciferol (VITAMIN D3) 50 MCG (2000 UT) TABS Take 1 tablet by  mouth daily.     Docosahexaenoic Acid (DHA OMEGA 3 PO) Take 500 mg by mouth daily. Takes 4 caps     ezetimibe (ZETIA) 10 MG tablet Take 10 mg by mouth daily.     famciclovir (FAMVIR) 500 MG tablet Take 250 mg by mouth as directed.  1   meloxicam (MOBIC) 15 MG tablet Take 15 mg by mouth daily as needed.     omeprazole (PRILOSEC) 40 MG capsule Take 40 mg by mouth every morning.     rosuvastatin (CRESTOR) 5 MG tablet 1 tablet     tadalafil (CIALIS) 20 MG tablet Take 20 mg by mouth as needed.     tamsulosin (FLOMAX) 0.4 MG CAPS capsule Take 1 capsule (0.4 mg total) by mouth daily after supper. 30 capsule 5   vitamin C (ASCORBIC ACID) 500 MG tablet Take 500 mg by mouth daily.     No current facility-administered medications for this encounter.   Facility-Administered Medications Ordered in Other Encounters  Medication Dose Route Frequency Provider Last Rate Last Admin   sodium phosphate (FLEET) 7-19 GM/118ML enema 1 enema  1 enema Rectal Once Franchot Gallo, MD        Physical Findings:  vitals were not taken for this visit.  Pain Assessment Pain Score: 0-No pain/10 Unable to assess due to telephone follow-up visit format.  Lab Findings: Lab Results  Component Value Date   WBC 7.3 08/31/2019   HGB 13.9 08/31/2019   HCT 43.1 08/31/2019   MCV 93.7 08/31/2019   PLT 240 08/31/2019     Radiographic Findings: No results found.  Impression/Plan: 1.  76 y.o. gentleman with Stage T1c adenocarcinoma of the prostate with Gleason score of 3+4, and PSA of 4.88.  He will continue to follow up with urology for ongoing PSA determinations and has an appointment scheduled with Dr. Diona Fanti on 04/15/22. He understands what to expect with regards to PSA monitoring going forward. I will look forward to following his response to treatment via correspondence with urology, and would be happy to continue to participate in his care if clinically indicated. I talked to the patient about what to expect in  the future, including his risk for erectile dysfunction and rectal bleeding. I encouraged him to call or return to the office if he has any questions regarding his previous radiation or possible radiation side effects. He was comfortable with this plan and will follow up as needed.     Nicholos Johns, PA-C

## 2022-03-31 NOTE — Progress Notes (Signed)
Telephone appointment. I verified patient's identity and began nursing interview. No prostate issues reported at this time.  Meaningful use complete. I-PSS score of 20-mild. Flomax as directed. Urology appt- April 15, 2022  Reminded patient of his 10:30am-03/31/22 telephone appointment w/ Ashlyn Bruning PA-C. I left my extension [phone] in case patient needs anything.  Patient contact [phone]

## 2022-04-11 DIAGNOSIS — D225 Melanocytic nevi of trunk: Secondary | ICD-10-CM | POA: Diagnosis not present

## 2022-04-11 DIAGNOSIS — L570 Actinic keratosis: Secondary | ICD-10-CM | POA: Diagnosis not present

## 2022-04-11 DIAGNOSIS — X32XXXD Exposure to sunlight, subsequent encounter: Secondary | ICD-10-CM | POA: Diagnosis not present

## 2022-04-15 DIAGNOSIS — C61 Malignant neoplasm of prostate: Secondary | ICD-10-CM | POA: Diagnosis not present

## 2022-04-21 ENCOUNTER — Encounter: Payer: Self-pay | Admitting: *Deleted

## 2022-04-22 ENCOUNTER — Encounter: Payer: Self-pay | Admitting: *Deleted

## 2022-04-22 NOTE — Progress Notes (Signed)
  Completed SCP treatment summary and mailed.

## 2022-04-26 ENCOUNTER — Inpatient Hospital Stay: Payer: Medicare HMO | Attending: Adult Health | Admitting: *Deleted

## 2022-04-26 ENCOUNTER — Encounter: Payer: Self-pay | Admitting: *Deleted

## 2022-04-26 DIAGNOSIS — C61 Malignant neoplasm of prostate: Secondary | ICD-10-CM

## 2022-04-26 NOTE — Progress Notes (Signed)
  2 Identifiers used for verification purposes only. No vital signs taken as this was a telephone visit. Allergies and medication reviewed and updated. Pt denies pain and fatigue today. Pt says his sleeping is back to normal. He gets up about twice at night to use bathroom which is his usual routine. Pt says the urinary frequency has gotten a lot better and his urine stream is back to normal. Pt is emptying bladder completely with every void. Pt stated, "My energy level is back to normal". He has returned to exercise regimen. We discussed diet as well. He eats very well as his wife is a vegetarian and he eats similar. Pt does smoke an occasional cigar when playing golf, he says. Bowel movement has resumed back to normal since radiation. Pt says sometimes he feel bloated or full in abdomen since radiation. Pt will see PCP in 2 weeks and discuss getting 2nd shingles vaccine.All other vaccines are updated. Annual exam in Dec. Last colonoscopy in 2018, due this year 2023.Last PSA was 1.28 with urologist. SCP reviewed and completed.

## 2022-04-28 DIAGNOSIS — E7800 Pure hypercholesterolemia, unspecified: Secondary | ICD-10-CM | POA: Diagnosis not present

## 2022-05-05 DIAGNOSIS — T466X5A Adverse effect of antihyperlipidemic and antiarteriosclerotic drugs, initial encounter: Secondary | ICD-10-CM | POA: Diagnosis not present

## 2022-05-05 DIAGNOSIS — E559 Vitamin D deficiency, unspecified: Secondary | ICD-10-CM | POA: Diagnosis not present

## 2022-05-05 DIAGNOSIS — Z789 Other specified health status: Secondary | ICD-10-CM | POA: Diagnosis not present

## 2022-05-05 DIAGNOSIS — I2510 Atherosclerotic heart disease of native coronary artery without angina pectoris: Secondary | ICD-10-CM | POA: Diagnosis not present

## 2022-05-05 DIAGNOSIS — C61 Malignant neoplasm of prostate: Secondary | ICD-10-CM | POA: Diagnosis not present

## 2022-05-05 DIAGNOSIS — E7800 Pure hypercholesterolemia, unspecified: Secondary | ICD-10-CM | POA: Diagnosis not present

## 2022-05-05 DIAGNOSIS — M791 Myalgia, unspecified site: Secondary | ICD-10-CM | POA: Diagnosis not present

## 2022-06-30 DIAGNOSIS — R42 Dizziness and giddiness: Secondary | ICD-10-CM | POA: Diagnosis not present

## 2022-07-06 DIAGNOSIS — H8113 Benign paroxysmal vertigo, bilateral: Secondary | ICD-10-CM | POA: Diagnosis not present

## 2022-08-16 DIAGNOSIS — C61 Malignant neoplasm of prostate: Secondary | ICD-10-CM | POA: Diagnosis not present

## 2022-08-24 DIAGNOSIS — Z8546 Personal history of malignant neoplasm of prostate: Secondary | ICD-10-CM | POA: Diagnosis not present

## 2022-09-01 DIAGNOSIS — Z125 Encounter for screening for malignant neoplasm of prostate: Secondary | ICD-10-CM | POA: Diagnosis not present

## 2022-09-01 DIAGNOSIS — E7800 Pure hypercholesterolemia, unspecified: Secondary | ICD-10-CM | POA: Diagnosis not present

## 2022-09-06 DIAGNOSIS — G4700 Insomnia, unspecified: Secondary | ICD-10-CM | POA: Diagnosis not present

## 2022-09-06 DIAGNOSIS — Z86010 Personal history of colonic polyps: Secondary | ICD-10-CM | POA: Diagnosis not present

## 2022-09-06 DIAGNOSIS — Z23 Encounter for immunization: Secondary | ICD-10-CM | POA: Diagnosis not present

## 2022-09-06 DIAGNOSIS — N529 Male erectile dysfunction, unspecified: Secondary | ICD-10-CM | POA: Diagnosis not present

## 2022-09-06 DIAGNOSIS — E559 Vitamin D deficiency, unspecified: Secondary | ICD-10-CM | POA: Diagnosis not present

## 2022-09-06 DIAGNOSIS — Z0000 Encounter for general adult medical examination without abnormal findings: Secondary | ICD-10-CM | POA: Diagnosis not present

## 2022-09-06 DIAGNOSIS — C61 Malignant neoplasm of prostate: Secondary | ICD-10-CM | POA: Diagnosis not present

## 2022-09-06 DIAGNOSIS — N400 Enlarged prostate without lower urinary tract symptoms: Secondary | ICD-10-CM | POA: Diagnosis not present

## 2022-09-06 DIAGNOSIS — I2510 Atherosclerotic heart disease of native coronary artery without angina pectoris: Secondary | ICD-10-CM | POA: Diagnosis not present

## 2022-09-06 DIAGNOSIS — K219 Gastro-esophageal reflux disease without esophagitis: Secondary | ICD-10-CM | POA: Diagnosis not present

## 2022-11-21 DIAGNOSIS — L308 Other specified dermatitis: Secondary | ICD-10-CM | POA: Diagnosis not present

## 2022-11-28 DIAGNOSIS — R300 Dysuria: Secondary | ICD-10-CM | POA: Diagnosis not present

## 2022-11-28 DIAGNOSIS — R21 Rash and other nonspecific skin eruption: Secondary | ICD-10-CM | POA: Diagnosis not present

## 2022-11-28 DIAGNOSIS — C61 Malignant neoplasm of prostate: Secondary | ICD-10-CM | POA: Diagnosis not present

## 2022-12-20 DIAGNOSIS — R69 Illness, unspecified: Secondary | ICD-10-CM | POA: Diagnosis not present

## 2022-12-23 DIAGNOSIS — R69 Illness, unspecified: Secondary | ICD-10-CM | POA: Diagnosis not present

## 2022-12-27 DIAGNOSIS — R69 Illness, unspecified: Secondary | ICD-10-CM | POA: Diagnosis not present

## 2023-01-12 DIAGNOSIS — R69 Illness, unspecified: Secondary | ICD-10-CM | POA: Diagnosis not present

## 2023-01-15 ENCOUNTER — Other Ambulatory Visit: Payer: Self-pay | Admitting: Radiation Oncology

## 2023-01-19 DIAGNOSIS — R69 Illness, unspecified: Secondary | ICD-10-CM | POA: Diagnosis not present

## 2023-01-26 DIAGNOSIS — R69 Illness, unspecified: Secondary | ICD-10-CM | POA: Diagnosis not present

## 2023-02-02 DIAGNOSIS — R69 Illness, unspecified: Secondary | ICD-10-CM | POA: Diagnosis not present

## 2023-02-08 DIAGNOSIS — R69 Illness, unspecified: Secondary | ICD-10-CM | POA: Diagnosis not present

## 2023-02-09 DIAGNOSIS — R69 Illness, unspecified: Secondary | ICD-10-CM | POA: Diagnosis not present

## 2023-02-23 DIAGNOSIS — R69 Illness, unspecified: Secondary | ICD-10-CM | POA: Diagnosis not present

## 2023-03-01 ENCOUNTER — Other Ambulatory Visit: Payer: Self-pay | Admitting: Radiation Oncology

## 2023-03-01 DIAGNOSIS — R69 Illness, unspecified: Secondary | ICD-10-CM | POA: Diagnosis not present

## 2023-03-08 DIAGNOSIS — R69 Illness, unspecified: Secondary | ICD-10-CM | POA: Diagnosis not present

## 2023-03-10 DIAGNOSIS — C61 Malignant neoplasm of prostate: Secondary | ICD-10-CM | POA: Diagnosis not present

## 2023-03-15 DIAGNOSIS — R69 Illness, unspecified: Secondary | ICD-10-CM | POA: Diagnosis not present

## 2023-03-17 DIAGNOSIS — N5201 Erectile dysfunction due to arterial insufficiency: Secondary | ICD-10-CM | POA: Diagnosis not present

## 2023-03-17 DIAGNOSIS — Z8546 Personal history of malignant neoplasm of prostate: Secondary | ICD-10-CM | POA: Diagnosis not present

## 2023-03-22 DIAGNOSIS — R69 Illness, unspecified: Secondary | ICD-10-CM | POA: Diagnosis not present

## 2023-03-26 ENCOUNTER — Other Ambulatory Visit: Payer: Self-pay | Admitting: Radiation Oncology

## 2023-04-05 DIAGNOSIS — R69 Illness, unspecified: Secondary | ICD-10-CM | POA: Diagnosis not present

## 2023-04-13 DIAGNOSIS — R69 Illness, unspecified: Secondary | ICD-10-CM | POA: Diagnosis not present

## 2023-04-19 DIAGNOSIS — R69 Illness, unspecified: Secondary | ICD-10-CM | POA: Diagnosis not present

## 2023-04-20 DIAGNOSIS — R69 Illness, unspecified: Secondary | ICD-10-CM | POA: Diagnosis not present

## 2023-04-21 ENCOUNTER — Other Ambulatory Visit: Payer: Self-pay | Admitting: Radiation Oncology

## 2023-04-24 DIAGNOSIS — M7712 Lateral epicondylitis, left elbow: Secondary | ICD-10-CM | POA: Diagnosis not present

## 2023-04-24 DIAGNOSIS — R5383 Other fatigue: Secondary | ICD-10-CM | POA: Diagnosis not present

## 2023-04-24 DIAGNOSIS — R0602 Shortness of breath: Secondary | ICD-10-CM | POA: Diagnosis not present

## 2023-04-27 DIAGNOSIS — R69 Illness, unspecified: Secondary | ICD-10-CM | POA: Diagnosis not present

## 2023-04-28 DIAGNOSIS — R69 Illness, unspecified: Secondary | ICD-10-CM | POA: Diagnosis not present

## 2023-05-03 DIAGNOSIS — R69 Illness, unspecified: Secondary | ICD-10-CM | POA: Diagnosis not present

## 2023-05-10 ENCOUNTER — Other Ambulatory Visit (HOSPITAL_COMMUNITY): Payer: Self-pay

## 2023-05-10 DIAGNOSIS — R69 Illness, unspecified: Secondary | ICD-10-CM | POA: Diagnosis not present

## 2023-05-10 DIAGNOSIS — R0602 Shortness of breath: Secondary | ICD-10-CM

## 2023-05-10 DIAGNOSIS — R5383 Other fatigue: Secondary | ICD-10-CM

## 2023-05-11 ENCOUNTER — Other Ambulatory Visit (HOSPITAL_COMMUNITY): Payer: Self-pay

## 2023-05-11 DIAGNOSIS — R0602 Shortness of breath: Secondary | ICD-10-CM

## 2023-05-11 DIAGNOSIS — R5383 Other fatigue: Secondary | ICD-10-CM

## 2023-05-12 ENCOUNTER — Ambulatory Visit (HOSPITAL_COMMUNITY): Payer: Medicare HMO | Attending: Cardiovascular Disease

## 2023-05-12 DIAGNOSIS — R0602 Shortness of breath: Secondary | ICD-10-CM | POA: Diagnosis not present

## 2023-05-12 DIAGNOSIS — R5383 Other fatigue: Secondary | ICD-10-CM | POA: Diagnosis not present

## 2023-05-12 LAB — MYOCARDIAL PERFUSION IMAGING
Angina Index: 0
Duke Treadmill Score: 9
Estimated workload: 10.2
Exercise duration (min): 9 min
Exercise duration (sec): 4 s
LV dias vol: 61 mL (ref 62–150)
LV sys vol: 23 mL
MPHR: 144 {beats}/min
Nuc Stress EF: 62 %
Peak HR: 130 {beats}/min
Percent HR: 90 %
Rest HR: 53 {beats}/min
Rest Nuclear Isotope Dose: 10.9 mCi
SDS: 1
SRS: 0
SSS: 1
ST Depression (mm): 0 mm
Stress Nuclear Isotope Dose: 30.1 mCi
TID: 0.84

## 2023-05-12 MED ORDER — TECHNETIUM TC 99M TETROFOSMIN IV KIT
10.9000 | PACK | Freq: Once | INTRAVENOUS | Status: AC | PRN
  Administered 2023-05-12: 10.9 via INTRAVENOUS

## 2023-05-12 MED ORDER — TECHNETIUM TC 99M TETROFOSMIN IV KIT
30.1000 | PACK | Freq: Once | INTRAVENOUS | Status: AC | PRN
  Administered 2023-05-12: 30.1 via INTRAVENOUS

## 2023-05-16 ENCOUNTER — Other Ambulatory Visit: Payer: Self-pay | Admitting: Radiation Oncology

## 2023-05-18 DIAGNOSIS — R69 Illness, unspecified: Secondary | ICD-10-CM | POA: Diagnosis not present

## 2023-05-24 DIAGNOSIS — R69 Illness, unspecified: Secondary | ICD-10-CM | POA: Diagnosis not present

## 2023-06-12 DIAGNOSIS — R300 Dysuria: Secondary | ICD-10-CM | POA: Diagnosis not present

## 2023-06-17 IMAGING — CT CT CARDIAC CORONARY ARTERY CALCIUM SCORE
3 series · 14 of 20 positions shown, 16 images · non-contrast
Comparison: None.

CLINICAL DATA: 75-year-old Caucasian male

EXAM:
CT CARDIAC CORONARY ARTERY CALCIUM SCORE
TECHNIQUE: Non-contrast imaging through the heart was performed using
prospective ECG gating. Image post processing was performed on an
independent workstation, allowing for quantitative analysis of the
heart and coronary arteries. Note that this exam targets the heart
and the chest was not imaged in its entirety.

[Series 2: calcium scoring 2.00 qr36 bestdiast 71% hrt calciu · axial · 0.36mm/px · z∈[+1656,+1752]mm · 4 of 80 slices shown]
[im 16/80  vessel]
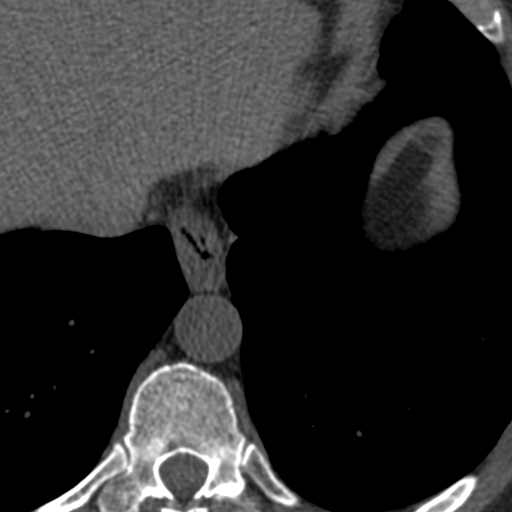
[im 32/80  vessel]
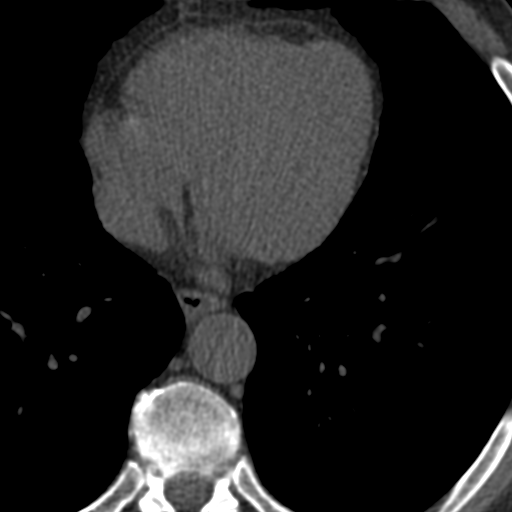
[im 48/80  vessel]
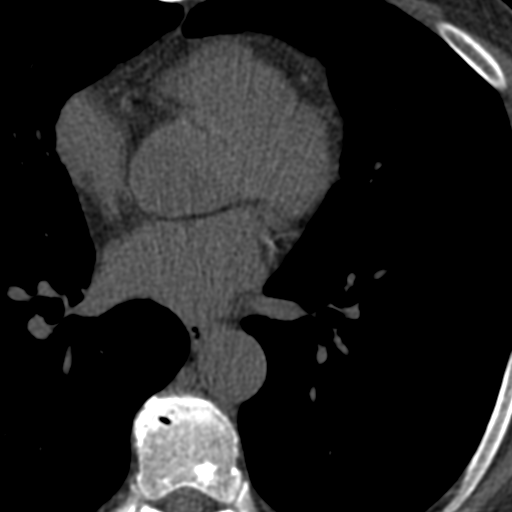
[im 64/80  vessel]
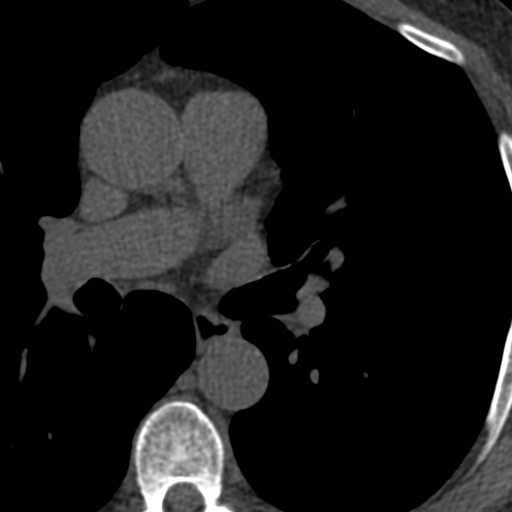

[Series 3: calcium scoring 2.00 br40 bestdiast 71% axial · axial · 0.54mm/px · z∈[+1652,+1756]mm · 5 of 80 slices shown, 7 images]
[im 14/80  vessel]
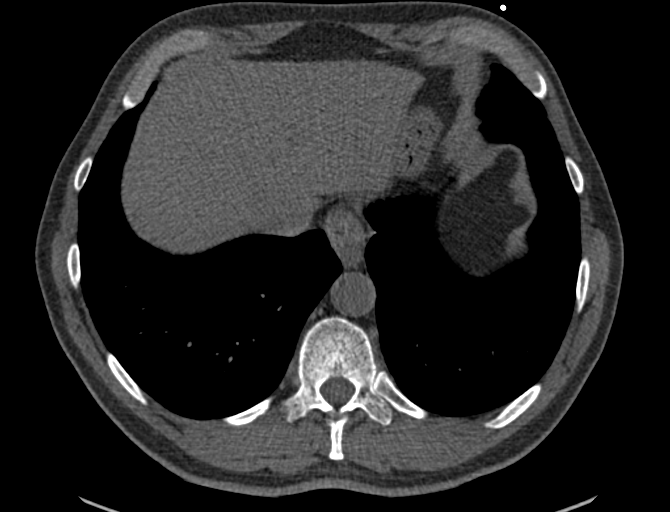
[im 14/80  lung]
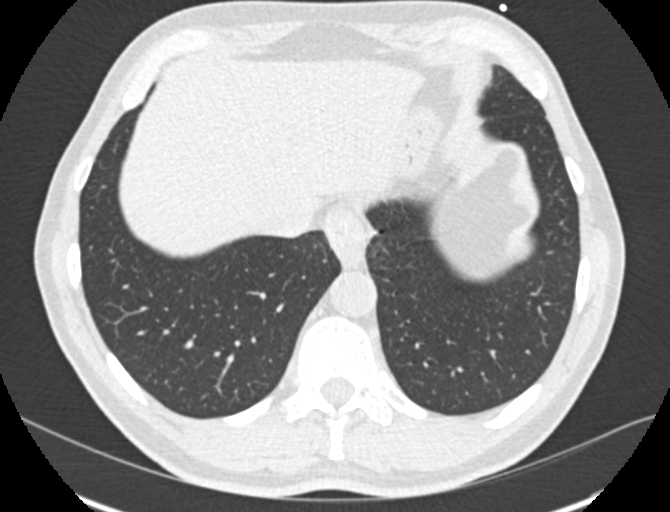
[im 27/80  vessel]
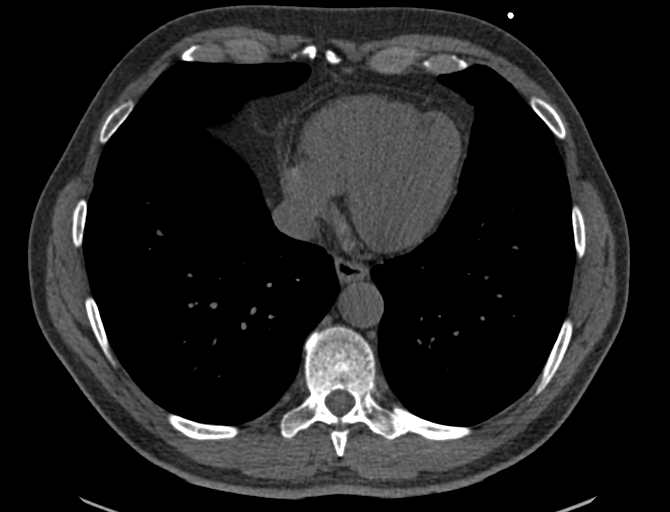
[im 40/80  vessel]
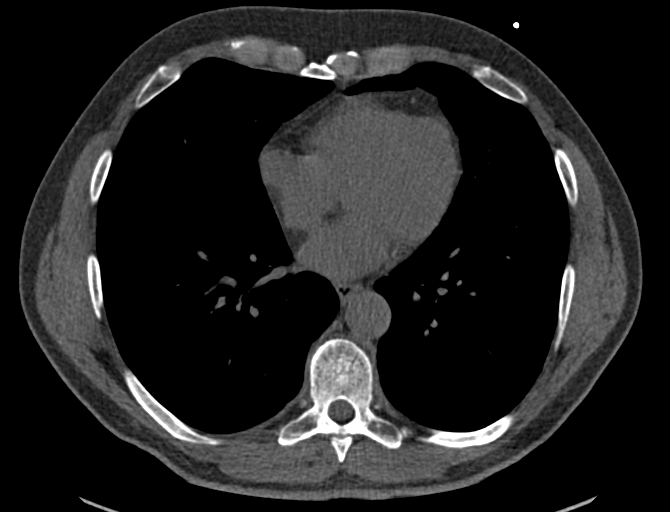
[im 53/80  vessel]
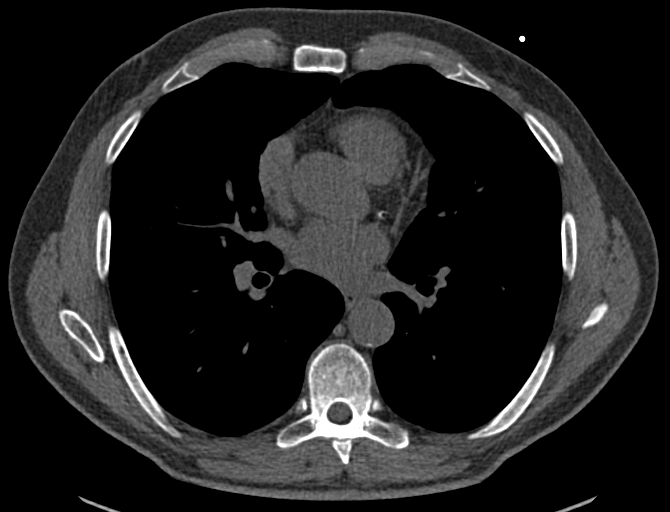
[im 66/80  vessel]
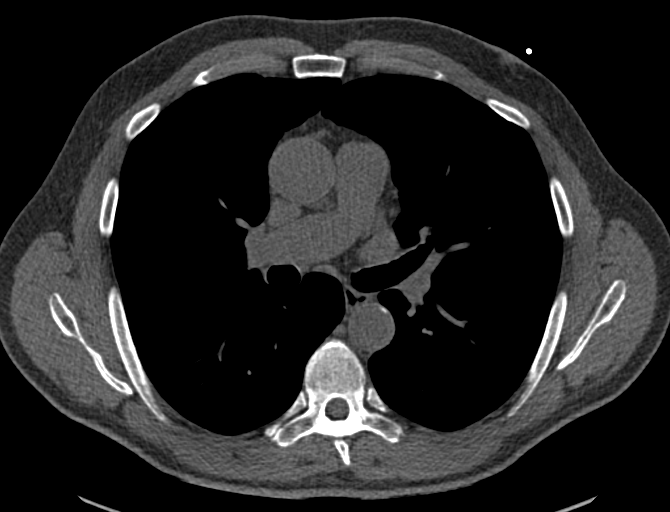
[im 66/80  lung]
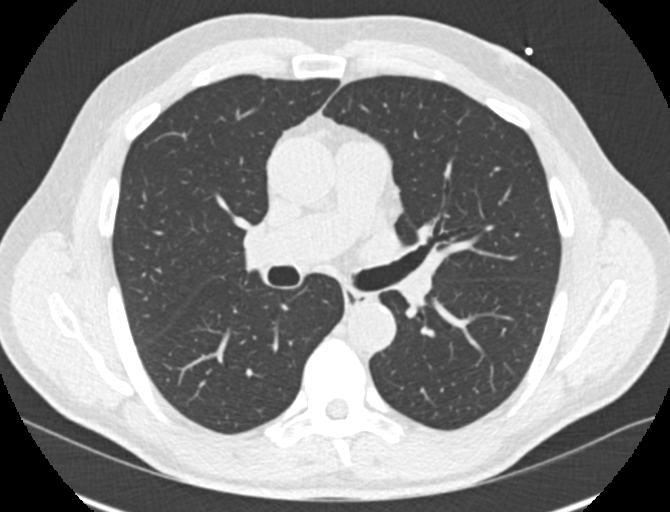

[Series 9: calcium scoring 2.00 br60 bestdiast 71% lungs · axial · 0.58mm/px · z∈[+1652,+1756]mm · 5 of 80 slices shown]
[im 14/80  vessel]
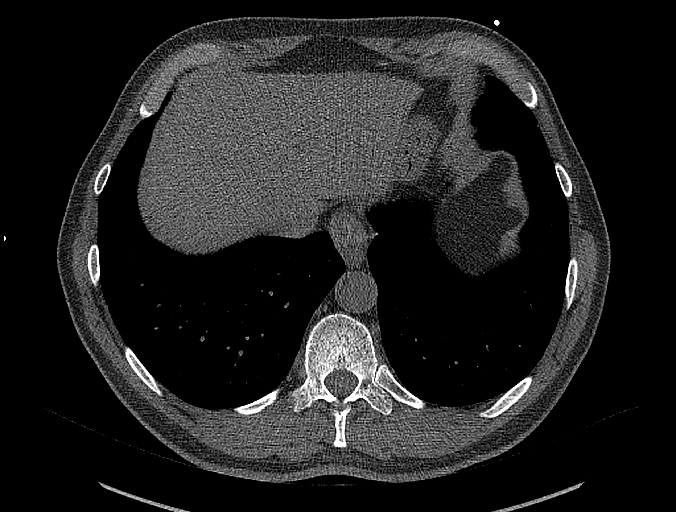
[im 27/80  vessel]
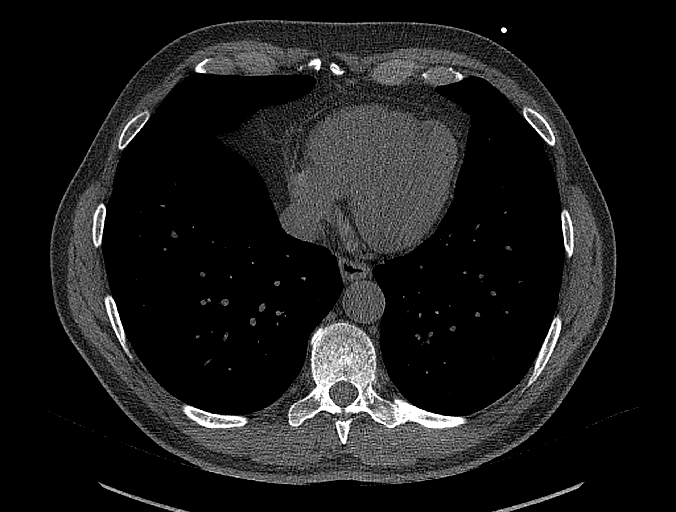
[im 40/80  vessel]
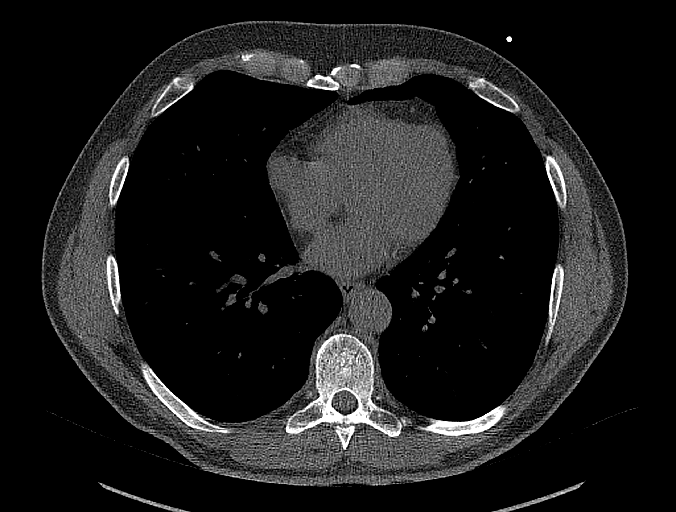
[im 53/80  vessel]
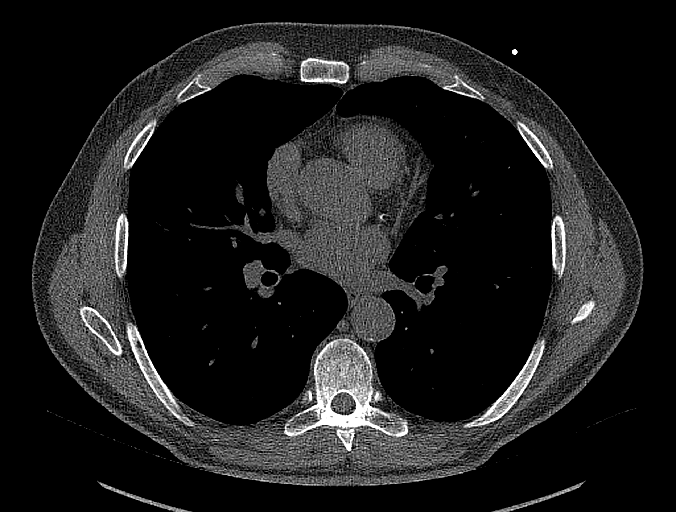
[im 66/80  vessel]
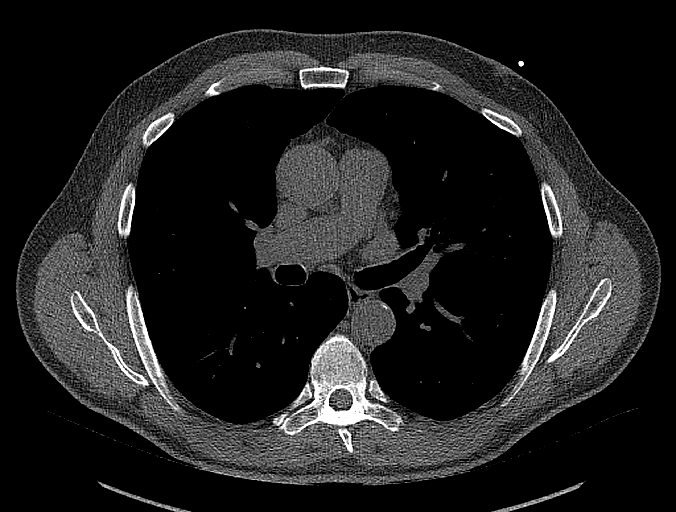

[14 of 20 positions shown; findings below may reference images not displayed]

FINDINGS: CORONARY CALCIUM SCORES:

Left Main: 0

LAD: 267

LCx: 23

RCA: 0

Total Agatston Score: 290

[HOSPITAL] percentile: 55

AORTA MEASUREMENTS:

Ascending Aorta: 34 mm

Descending Aorta: 24 mm

OTHER FINDINGS:

The heart size is within normal limits. No pericardial fluid is
identified. Mild atherosclerosis of the thoracic aorta. Visualized
segments of the thoracic aorta and central pulmonary arteries are
normal in caliber. Visualized mediastinum and hilar regions
demonstrate no lymphadenopathy or masses. 5 mm fissural nodule in
the minor fissure of the right lung most likely represents an
intrapulmonary lymph node and is flat in shape on reconstructions.
Visualized lungs show no evidence of pulmonary edema, consolidation,
pneumothorax or pleural fluid. Visualized upper abdomen and bony
structures are unremarkable.
IMPRESSION: 1. Coronary calcium score of 290 is at the 55th percentile for the
patient's sex, age and race.
2. 5 mm flat fissural nodule in the right minor fissure most likely
represents an intrapulmonary lymph node and does not require
follow-up.

## 2023-07-11 ENCOUNTER — Ambulatory Visit: Payer: No Typology Code available for payment source | Admitting: Cardiovascular Disease

## 2023-07-26 DIAGNOSIS — H2513 Age-related nuclear cataract, bilateral: Secondary | ICD-10-CM | POA: Diagnosis not present

## 2023-07-26 DIAGNOSIS — H18413 Arcus senilis, bilateral: Secondary | ICD-10-CM | POA: Diagnosis not present

## 2023-07-26 DIAGNOSIS — H25013 Cortical age-related cataract, bilateral: Secondary | ICD-10-CM | POA: Diagnosis not present

## 2023-07-26 DIAGNOSIS — H40053 Ocular hypertension, bilateral: Secondary | ICD-10-CM | POA: Diagnosis not present

## 2023-07-26 DIAGNOSIS — D3131 Benign neoplasm of right choroid: Secondary | ICD-10-CM | POA: Diagnosis not present

## 2023-09-11 ENCOUNTER — Other Ambulatory Visit: Payer: Self-pay | Admitting: Radiation Oncology

## 2023-09-11 DIAGNOSIS — C61 Malignant neoplasm of prostate: Secondary | ICD-10-CM | POA: Diagnosis not present

## 2023-09-11 DIAGNOSIS — I2510 Atherosclerotic heart disease of native coronary artery without angina pectoris: Secondary | ICD-10-CM | POA: Diagnosis not present

## 2023-09-11 DIAGNOSIS — H532 Diplopia: Secondary | ICD-10-CM | POA: Diagnosis not present

## 2023-09-11 DIAGNOSIS — G4700 Insomnia, unspecified: Secondary | ICD-10-CM | POA: Diagnosis not present

## 2023-09-11 DIAGNOSIS — R251 Tremor, unspecified: Secondary | ICD-10-CM | POA: Diagnosis not present

## 2023-09-11 DIAGNOSIS — K22719 Barrett's esophagus with dysplasia, unspecified: Secondary | ICD-10-CM | POA: Diagnosis not present

## 2023-09-11 DIAGNOSIS — Z87442 Personal history of urinary calculi: Secondary | ICD-10-CM | POA: Diagnosis not present

## 2023-09-11 DIAGNOSIS — N400 Enlarged prostate without lower urinary tract symptoms: Secondary | ICD-10-CM | POA: Diagnosis not present

## 2023-09-11 DIAGNOSIS — Z0000 Encounter for general adult medical examination without abnormal findings: Secondary | ICD-10-CM | POA: Diagnosis not present

## 2023-09-11 DIAGNOSIS — K219 Gastro-esophageal reflux disease without esophagitis: Secondary | ICD-10-CM | POA: Diagnosis not present

## 2023-09-11 DIAGNOSIS — N529 Male erectile dysfunction, unspecified: Secondary | ICD-10-CM | POA: Diagnosis not present

## 2023-09-28 DIAGNOSIS — R300 Dysuria: Secondary | ICD-10-CM | POA: Diagnosis not present

## 2023-09-28 DIAGNOSIS — N5201 Erectile dysfunction due to arterial insufficiency: Secondary | ICD-10-CM | POA: Diagnosis not present

## 2023-09-28 DIAGNOSIS — Z8546 Personal history of malignant neoplasm of prostate: Secondary | ICD-10-CM | POA: Diagnosis not present

## 2023-10-07 ENCOUNTER — Other Ambulatory Visit: Payer: Self-pay | Admitting: Radiation Oncology

## 2023-10-10 DIAGNOSIS — I2510 Atherosclerotic heart disease of native coronary artery without angina pectoris: Secondary | ICD-10-CM | POA: Diagnosis not present

## 2023-10-19 ENCOUNTER — Encounter: Payer: Self-pay | Admitting: Gastroenterology

## 2023-11-03 ENCOUNTER — Other Ambulatory Visit: Payer: Self-pay | Admitting: Radiation Oncology

## 2023-11-17 DIAGNOSIS — X32XXXD Exposure to sunlight, subsequent encounter: Secondary | ICD-10-CM | POA: Diagnosis not present

## 2023-11-17 DIAGNOSIS — L570 Actinic keratosis: Secondary | ICD-10-CM | POA: Diagnosis not present

## 2023-12-01 ENCOUNTER — Other Ambulatory Visit: Payer: Self-pay | Admitting: Radiation Oncology

## 2023-12-04 ENCOUNTER — Ambulatory Visit (AMBULATORY_SURGERY_CENTER): Payer: Medicare HMO

## 2023-12-04 VITALS — Ht 66.0 in | Wt 156.0 lb

## 2023-12-04 DIAGNOSIS — Z860100 Personal history of colon polyps, unspecified: Secondary | ICD-10-CM

## 2023-12-04 MED ORDER — NA SULFATE-K SULFATE-MG SULF 17.5-3.13-1.6 GM/177ML PO SOLN: 1.0000 | Freq: Once | ORAL | 0 refills | Status: AC

## 2023-12-04 NOTE — Progress Notes (Signed)

## 2024-01-02 ENCOUNTER — Encounter: Payer: Medicare HMO | Admitting: Gastroenterology

## 2024-01-11 ENCOUNTER — Encounter: Payer: Self-pay | Admitting: Gastroenterology

## 2024-01-16 ENCOUNTER — Encounter: Payer: Self-pay | Admitting: Gastroenterology

## 2024-01-16 ENCOUNTER — Ambulatory Visit (AMBULATORY_SURGERY_CENTER): Payer: Medicare HMO | Admitting: Gastroenterology

## 2024-01-16 VITALS — BP 127/78 | HR 60 | Temp 98.1°F | Resp 15

## 2024-01-16 DIAGNOSIS — K5730 Diverticulosis of large intestine without perforation or abscess without bleeding: Secondary | ICD-10-CM | POA: Diagnosis not present

## 2024-01-16 DIAGNOSIS — K5520 Angiodysplasia of colon without hemorrhage: Secondary | ICD-10-CM

## 2024-01-16 DIAGNOSIS — Z860100 Personal history of colon polyps, unspecified: Secondary | ICD-10-CM

## 2024-01-16 DIAGNOSIS — K627 Radiation proctitis: Secondary | ICD-10-CM

## 2024-01-16 DIAGNOSIS — K648 Other hemorrhoids: Secondary | ICD-10-CM | POA: Diagnosis not present

## 2024-01-16 DIAGNOSIS — Z860101 Personal history of adenomatous and serrated colon polyps: Secondary | ICD-10-CM | POA: Diagnosis not present

## 2024-01-16 DIAGNOSIS — K6289 Other specified diseases of anus and rectum: Secondary | ICD-10-CM | POA: Diagnosis not present

## 2024-01-16 DIAGNOSIS — Z1211 Encounter for screening for malignant neoplasm of colon: Secondary | ICD-10-CM

## 2024-01-16 DIAGNOSIS — E785 Hyperlipidemia, unspecified: Secondary | ICD-10-CM | POA: Diagnosis not present

## 2024-01-16 MED ORDER — SODIUM CHLORIDE 0.9 % IV SOLN: 500.0000 mL | Freq: Once | INTRAVENOUS | Status: DC

## 2024-01-16 NOTE — Op Note (Signed)
 [facility] Patient Name: Henry [name] Procedure Date: 01/16/2024 9:13 AM MRN: [id number] Endoscopist: Landon Pinion P. General Kenner , MD, [id number] Age: 78 Referring MD:  Date of Birth: 1945-10-24 Gender: Male Account #: [id number] Procedure:                Colonoscopy Indications:              High risk colon cancer surveillance: Personal                            history of colonic polyps - adenoma removed 12/2016,                            of note, history of prostate cancer s/p radiation                            treatment Medicines:                Monitored Anesthesia Care Procedure:                Pre-Anesthesia Assessment:                           - Prior to the procedure, a History and Physical                            was performed, and patient medications and                            allergies were reviewed. The patient's tolerance of                            previous anesthesia was also reviewed. The risks                            and benefits of the procedure and the sedation                            options and risks were discussed with the patient.                            All questions were answered, and informed consent                            was obtained. Prior Anticoagulants: The patient has                            taken no anticoagulant or antiplatelet agents. ASA                            Grade Assessment: II - A patient with mild systemic                            disease. After reviewing the risks and benefits,  the patient was deemed in satisfactory condition to                            undergo the procedure.                           After obtaining informed consent, the colonoscope                            was passed under direct vision. Throughout the                            procedure, the patient's blood pressure, pulse, and                            oxygen saturations were monitored continuously.  The                            Olympus Scope SN [id number] was introduced through the                            anus and advanced to the the cecum, identified by                            appendiceal orifice and ileocecal valve. The                            colonoscopy was performed without difficulty. The                            patient tolerated the procedure well. The quality                            of the bowel preparation was adequate. The                            ileocecal valve, appendiceal orifice, and rectum                            were photographed. Scope In: 9:28:12 AM Scope Out: 9:45:37 AM Scope Withdrawal Time: 0 hours 12 minutes 24 seconds  Total Procedure Duration: 0 hours 17 minutes 25 seconds  Findings:                 The perianal and digital rectal examinations were                            normal.                           A few small-mouthed diverticula were found in the                            sigmoid colon.  A few small angiodysplastic lesions were found in                            the rectum - consistent with mild radiation                            proctitis.                           Anal papilla(e) were hypertrophied.                           Internal hemorrhoids were found during                            retroflexion. The hemorrhoids were small.                           The exam was otherwise without abnormality. Prep                            was adequate however significant residual stool                            noted throughout leading to extensive lavage to                            obtain adequate views. Complications:            No immediate complications. Estimated blood loss:                            None. Estimated Blood Loss:     Estimated blood loss: none. Impression:               - Diverticulosis in the sigmoid colon.                           - Mild radiation proctitis.                            - Anal papilla(e) were hypertrophied.                           - Internal hemorrhoids.                           - The examination was otherwise normal.                           - No specimens collected. Recommendation:           - Patient has a contact number available for                            emergencies. The signs and symptoms of potential  delayed complications were discussed with the                            patient. Return to normal activities tomorrow.                            Written discharge instructions were provided to the                            patient.                           - Resume previous diet.                           - Continue present medications.                           - No further surveillance colonoscopy is                            recommended given the results of this exam and the                            patient's age (he would not be due again for                            another 10 years - will be 78 years old at that                            time) Lendon Queen. Sedale Jenifer, MD 01/16/2024 9:51:31 AM This report has been signed electronically.

## 2024-01-16 NOTE — Progress Notes (Signed)
 Sedate, gd SR, tolerated procedure well, VSS, report to RN

## 2024-01-16 NOTE — Patient Instructions (Addendum)
 Thank you for letting us take care of your healthcare needs today. Please see handouts given to you on Diverticulosis and Hemorrhoids.    YOU HAD AN ENDOSCOPIC PROCEDURE TODAY AT THE [facility]:   Refer to the procedure report that was given to you for any specific questions about what was found during the examination.  If the procedure report does not answer your questions, please call your gastroenterologist to clarify.  If you requested that your care partner not be given the details of your procedure findings, then the procedure report has been included in a sealed envelope for you to review at your convenience later.  YOU SHOULD EXPECT: Some feelings of bloating in the abdomen. Passage of more gas than usual.  Walking can help get rid of the air that was put into your GI tract during the procedure and reduce the bloating. If you had a lower endoscopy (such as a colonoscopy or flexible sigmoidoscopy) you may notice spotting of blood in your stool or on the toilet paper. If you underwent a bowel prep for your procedure, you may not have a normal bowel movement for a few days.  Please Note:  You might notice some irritation and congestion in your nose or some drainage.  This is from the oxygen used during your procedure.  There is no need for concern and it should clear up in a day or so.  SYMPTOMS TO REPORT IMMEDIATELY:  Following lower endoscopy (colonoscopy or flexible sigmoidoscopy):  Excessive amounts of blood in the stool  Significant tenderness or worsening of abdominal pains  Swelling of the abdomen that is new, acute  Fever of 100F or higher   For urgent or emergent issues, a gastroenterologist can be reached at any hour by calling [phone]. Do not use MyChart messaging for urgent concerns.    DIET:  We do recommend a small meal at first, but then you may proceed to your regular diet.  Drink plenty of fluids but you should avoid alcoholic beverages for 24  hours.  ACTIVITY:  You should plan to take it easy for the rest of today and you should NOT DRIVE or use heavy machinery until tomorrow (because of the sedation medicines used during the test).    FOLLOW UP: Our staff will call the number listed on your records the next business day following your procedure.  We will call around 7:15- 8:00 am to check on you and address any questions or concerns that you may have regarding the information given to you following your procedure. If we do not reach you, we will leave a message.     If any biopsies were taken you will be contacted by phone or by letter within the next 1-3 weeks.  Please call us at [phone] if you have not heard about the biopsies in 3 weeks.    SIGNATURES/CONFIDENTIALITY: You and/or your care partner have signed paperwork which will be entered into your electronic medical record.  These signatures attest to the fact that that the information above on your After Visit Summary has been reviewed and is understood.  Full responsibility of the confidentiality of this discharge information lies with you and/or your care-partner.

## 2024-01-16 NOTE — Progress Notes (Signed)
 Pt's states no medical or surgical changes since previsit or office visit.

## 2024-01-16 NOTE — Progress Notes (Signed)
 [facility] History and Physical   Primary Care Physician:  Henry Man, MD   Reason for Procedure:   History of colon polyps  Plan:    colonoscopy     HPI: Henry [name] is a 78 y.o. male  here for colonoscopy surveillance - last exam 4/18 - one adenoma.   Patient denies any bowel symptoms at this time. No family history of colon cancer known. Otherwise feels well without any cardiopulmonary symptoms. He does have a history of prostate CA treated with radiation in the past.  I have discussed risks / benefits of anesthesia and endoscopic procedure with Henry [name] and they wish to proceed with the exams as outlined today.    Past Medical History:  Diagnosis Date   Allergic rhinitis    Barrett's esophagus without dysplasia    followed by dr Henry [name]   BPH associated with nocturia    Diverticulosis of colon    ED (erectile dysfunction)    Family history of adverse reaction to anesthesia    mother--- ponv   GERD (gastroesophageal reflux disease)    History of bladder stone    History of kidney stones    Hx of adenomatous polyp of colon    Hyperlipidemia    Internal hemorrhoids    Tinnitus, bilateral    Vertigo    Wears glasses    Wears hearing aid in both ears     Past Surgical History:  Procedure Laterality Date   ABDOMINAL HERNIA REPAIR  2008   COLONOSCOPY  01/17/2017   by dr Henry [name]   ESOPHAGOGASTRODUODENOSCOPY  09/14/2021   by dr Henry [name]   GOLD SEED IMPLANT N/A 12/27/2021   Procedure: GOLD SEED IMPLANT;  Surgeon: Henry Frizzle, MD;  Location: [facility];  Service: Urology;  Laterality: N/A;   LUMBAR LAMINECTOMY  1978   per pt 2 wks later had  surgery to remove bone fragment   SPACE OAR INSTILLATION N/A 12/27/2021   Procedure: SPACE OAR INSTILLATION;  Surgeon: Henry Frizzle, MD;  Location: [facility];  Service: Urology;  Laterality: N/A;    Prior to Admission medications   Medication Sig Start  Date End Date Taking? Authorizing Provider  acetaminophen (TYLENOL) 325 MG tablet Take 650 mg by mouth every 6 (six) hours as needed. Take 2 pills daily as needed   Yes [provider]  Cholecalciferol (VITAMIN D3) 50 MCG (2000 UT) TABS Take 1 tablet by mouth daily.   Yes [provider]  Docosahexaenoic Acid (DHA OMEGA 3 PO) Take 500 mg by mouth daily. Takes 4 caps   Yes [provider]  ezetimibe (ZETIA) 10 MG tablet Take 10 mg by mouth daily. 11/29/21  Yes [provider]  omeprazole (PRILOSEC) 40 MG capsule Take 40 mg by mouth every morning. 08/31/21  Yes [provider]  rosuvastatin (CRESTOR) 5 MG tablet Take 5 mg by mouth 3 (three) times a week. MWF 02/02/22  Yes [provider]  tamsulosin  (FLOMAX ) 0.4 MG CAPS capsule TAKE 1 CAPSULE BY MOUTH ONCE DAILY AFTER SUPPER 12/05/23  Yes Henry Payer, MD  cetirizine (ZYRTEC) 10 MG tablet Take 10 mg by mouth daily as needed for allergies.    [provider]  famciclovir (FAMVIR) 500 MG tablet Take 250 mg by mouth as directed. Patient not taking: Reported on 04/26/2022 03/05/18   [provider]  meclizine  (ANTIVERT ) 25 MG tablet Take 25 mg by mouth 3 (three) times daily as needed for dizziness or  nausea. Patient not taking: Reported on 12/04/2023    [provider]  meloxicam  (MOBIC ) 15 MG tablet Take 15 mg by mouth daily as needed. Patient not taking: Reported on 04/26/2022 09/27/21   [provider]  ondansetron  (ZOFRAN -ODT) 4 MG disintegrating tablet Take 4 mg by mouth every 8 (eight) hours as needed for nausea or vomiting. Patient not taking: Reported on 12/04/2023    [provider]  tadalafil (CIALIS) 20 MG tablet Take 20 mg by mouth as needed.    [provider]  URIBEL 81.6 MG TABS Take 1 tablet by mouth 4 (four) times daily as needed. Patient not taking: Reported on 12/04/2023 09/29/23   [provider]  zolpidem (AMBIEN) 5 MG tablet  Take 1 tablet by mouth at bedtime as needed. Patient not taking: Reported on 01/16/2024 04/06/23   [provider]    Current Outpatient Medications  Medication Sig Dispense Refill   acetaminophen (TYLENOL) 325 MG tablet Take 650 mg by mouth every 6 (six) hours as needed. Take 2 pills daily as needed     Cholecalciferol (VITAMIN D3) 50 MCG (2000 UT) TABS Take 1 tablet by mouth daily.     Docosahexaenoic Acid (DHA OMEGA 3 PO) Take 500 mg by mouth daily. Takes 4 caps     ezetimibe (ZETIA) 10 MG tablet Take 10 mg by mouth daily.     omeprazole (PRILOSEC) 40 MG capsule Take 40 mg by mouth every morning.     rosuvastatin (CRESTOR) 5 MG tablet Take 5 mg by mouth 3 (three) times a week. MWF     tamsulosin  (FLOMAX ) 0.4 MG CAPS capsule TAKE 1 CAPSULE BY MOUTH ONCE DAILY AFTER SUPPER 30 capsule 0   cetirizine (ZYRTEC) 10 MG tablet Take 10 mg by mouth daily as needed for allergies.     famciclovir (FAMVIR) 500 MG tablet Take 250 mg by mouth as directed. (Patient not taking: Reported on 04/26/2022)  1   meclizine  (ANTIVERT ) 25 MG tablet Take 25 mg by mouth 3 (three) times daily as needed for dizziness or nausea. (Patient not taking: Reported on 12/04/2023)     meloxicam  (MOBIC ) 15 MG tablet Take 15 mg by mouth daily as needed. (Patient not taking: Reported on 04/26/2022)     ondansetron  (ZOFRAN -ODT) 4 MG disintegrating tablet Take 4 mg by mouth every 8 (eight) hours as needed for nausea or vomiting. (Patient not taking: Reported on 12/04/2023)     tadalafil (CIALIS) 20 MG tablet Take 20 mg by mouth as needed.     URIBEL 81.6 MG TABS Take 1 tablet by mouth 4 (four) times daily as needed. (Patient not taking: Reported on 12/04/2023)     zolpidem (AMBIEN) 5 MG tablet Take 1 tablet by mouth at bedtime as needed. (Patient not taking: Reported on 01/16/2024)     Current Facility-Administered Medications  Medication Dose Route Frequency Provider Last Rate Last Admin   0.9 %  sodium chloride  infusion  500 mL  Intravenous Once Henry [name], Henry Queen, MD       Facility-Administered Medications Ordered in Other Visits  Medication Dose Route Frequency Provider Last Rate Last Admin   sodium phosphate (FLEET) 7-19 GM/118ML enema 1 enema  1 enema Rectal Once Dahlstedt, Stephen, MD        Allergies as of 01/16/2024   (No Known Allergies)    Family History  Problem Relation Age of Onset   Alzheimer's disease Mother    Alcohol abuse Father    Brain cancer Paternal  Grandfather    Colon cancer Neg Hx    Esophageal cancer Neg Hx    Stomach cancer Neg Hx    Rectal cancer Neg Hx     Social History   Socioeconomic History   Marital status: Married    Spouse name: Not on file   Number of children: 3   Years of education: Not on file   Highest education level: Not on file  Occupational History   Occupation: retired  Tobacco Use   Smoking status: Some Days    Current packs/day: 0.00    Types: Cigarettes, Cigars    Last attempt to quit: 1985    Years since quitting: 40.3   Smokeless tobacco: Never   Tobacco comments:    12-21-2021  per pt quit cigarettes 1985 for 20 yrs ,  now occasional cigar when play golf  Vaping Use   Vaping status: Never Used  Substance and Sexual Activity   Alcohol use: No    Alcohol/week: 0.0 standard drinks of alcohol   Drug use: Never   Sexual activity: Not on file  Other Topics Concern   Not on file  Social History Narrative   Not on file   Social Drivers of Health   Financial Resource Strain: Not on file  Food Insecurity: Not on file  Transportation Needs: Not on file  Physical Activity: Not on file  Stress: Not on file  Social Connections: Not on file  Intimate Partner Violence: Not on file    Review of Systems: All other review of systems negative except as mentioned in the HPI.  Physical Exam: Vital signs Temp 98.1 F (36.7 C)   Henry:   Alert,  Well-developed, pleasant and cooperative in NAD Lungs:  Clear throughout to auscultation.    Heart:  Regular rate and rhythm Abdomen:  Soft, nontender and nondistended.   Neuro/Psych:  Alert and cooperative. Normal mood and affect. A and O x 3  Christi Coward, MD [facility]

## 2024-01-17 ENCOUNTER — Telehealth: Payer: Self-pay

## 2024-01-17 NOTE — Telephone Encounter (Signed)
  Follow up Call-     01/16/2024    8:33 AM 09/14/2021    9:17 AM  Call back number  Post procedure Call Back phone  # [phone]  Permission to leave phone message Yes Yes     Patient questions:  Do you have a fever, pain , or abdominal swelling? No. Pain Score  0 *  Have you tolerated food without any problems? Yes.    Have you been able to return to your normal activities? Yes.    Do you have any questions about your discharge instructions: Diet   No. Medications  No. Follow up visit  No.  Do you have questions or concerns about your Care? No.  Actions: * If pain score is 4 or above: No action needed, pain <4.

## 2024-02-26 ENCOUNTER — Ambulatory Visit: Admitting: Neurology

## 2024-02-26 ENCOUNTER — Telehealth: Payer: Self-pay | Admitting: Neurology

## 2024-02-26 ENCOUNTER — Encounter: Payer: Self-pay | Admitting: Neurology

## 2024-02-26 VITALS — BP 140/71 | HR 74 | Ht 66.0 in | Wt 150.5 lb

## 2024-02-26 DIAGNOSIS — H532 Diplopia: Secondary | ICD-10-CM | POA: Diagnosis not present

## 2024-02-26 NOTE — Patient Instructions (Addendum)
 MRI Brain with and without contrast, recent labs at Doctor office in December BUN 24, creatinine 1.49 and eGFR 63 I will contact you to go over the results  Continue to follow up with your doctors  Return as needed

## 2024-02-26 NOTE — Telephone Encounter (Signed)
 sent to GI they obtain [facility] Berkley Harvey [phone]

## 2024-02-26 NOTE — Progress Notes (Signed)
 [facility]  PATIENT: Henry [name] DOB: 06/03/1946  REQUESTING CLINICIAN: Imelda Man, MD HISTORY FROM: Patient/Chart review REASON FOR VISIT: Double vision   HISTORICAL  CHIEF COMPLAINT:  Chief Complaint  Patient presents with   New Patient (Initial Visit)    RM 13. NP proficient paper referral for Double vision/Dr. Imelda Man [facility] [phone]. Vision test completed. Pt reports double vision began at end of March - occurred while driving in traffic. Pt reports has happened 4/5 times since then. Pt reports episodes last about 30 - 60 seconds.pt reports most recent episode was 3 nights ago.     HISTORY OF PRESENT ILLNESS:  This is 78 year old gentleman with past medical history of hyperlipidemia, insomnia, GERD, vertigo, who is presenting with complaint of double vision.  Patient reports experiencing double vision the last week and of March while driving.  He tells me he had distorted vision, felt like he was seeing 2 images.  Episode lasted about 30 seconds to a minute.  He was able to pull over and have someone else drive him.  Since then he continued to have these occasional double vision episodes, a total of 5; last 1 was 3 days ago.  This occurred randomly, while reading, and also in the shower.  They last less than 30 seconds.  There are no associated headache, no weakness, denies any loss of vision tells me that when  it happened he just cannot see or read.  He did have a recent ophthalmology evaluation, was told that everything was normal.  Denies any recent injury, no provoking factors.  Never had this in the past, denies any previous history of migraine headaches or ocular migraines.   OTHER MEDICAL CONDITIONS: Hyperlipidemia, Insomnia, GERD, Vertigo    REVIEW OF SYSTEMS: Full 14 system review of systems performed and negative with exception of: As noted in the HPI   ALLERGIES: No Known Allergies  HOME MEDICATIONS: Outpatient Medications  Prior to Visit  Medication Sig Dispense Refill   acetaminophen (TYLENOL) 325 MG tablet Take 650 mg by mouth every 6 (six) hours as needed. Take 2 pills daily as needed     cetirizine (ZYRTEC) 10 MG tablet Take 10 mg by mouth daily as needed for allergies.     Cholecalciferol (VITAMIN D3) 50 MCG (2000 UT) TABS Take 1 tablet by mouth daily.     Docosahexaenoic Acid (DHA OMEGA 3 PO) Take 500 mg by mouth daily. Takes 4 caps     ezetimibe (ZETIA) 10 MG tablet Take 10 mg by mouth daily.     famciclovir (FAMVIR) 500 MG tablet Take 250 mg by mouth as directed.  1   meclizine  (ANTIVERT ) 25 MG tablet Take 25 mg by mouth 3 (three) times daily as needed for dizziness or nausea.     meloxicam  (MOBIC ) 15 MG tablet Take 15 mg by mouth daily as needed.     omeprazole (PRILOSEC) 40 MG capsule Take 40 mg by mouth every morning.     ondansetron  (ZOFRAN -ODT) 4 MG disintegrating tablet Take 4 mg by mouth every 8 (eight) hours as needed for nausea or vomiting.     rosuvastatin (CRESTOR) 5 MG tablet Take 5 mg by mouth 3 (three) times a week. MWF     tadalafil (CIALIS) 20 MG tablet Take 20 mg by mouth as needed.     tamsulosin  (FLOMAX ) 0.4 MG CAPS capsule TAKE 1 CAPSULE BY MOUTH ONCE DAILY AFTER SUPPER 30 capsule 0   zolpidem (AMBIEN) 5 MG  tablet Take 1 tablet by mouth at bedtime as needed.     URIBEL 81.6 MG TABS Take 1 tablet by mouth 4 (four) times daily as needed. 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 dysplasia    followed by dr General Kenner   BPH associated with nocturia    Diverticulosis of colon    ED (erectile dysfunction)    Family history of adverse reaction to  anesthesia    mother--- ponv   GERD (gastroesophageal reflux disease)    History of bladder stone    History of kidney stones    Hx of adenomatous polyp of colon    Hyperlipidemia    Internal hemorrhoids    Tinnitus, bilateral    Vertigo    Wears glasses    Wears hearing aid in both ears     PAST SURGICAL HISTORY: Past Surgical History:  Procedure Laterality Date   ABDOMINAL HERNIA REPAIR  2008   COLONOSCOPY  01/17/2017   by dr General Kenner   ESOPHAGOGASTRODUODENOSCOPY  09/14/2021   by dr General Kenner   GOLD SEED IMPLANT N/A 12/27/2021   Procedure: GOLD SEED IMPLANT;  Surgeon: Trent Frizzle, MD;  Location: [facility];  Service: Urology;  Laterality: N/A;   LUMBAR LAMINECTOMY  1978   per pt 2 wks later had  surgery to remove bone fragment   SPACE OAR INSTILLATION N/A 12/27/2021   Procedure: SPACE OAR INSTILLATION;  Surgeon: Trent Frizzle, MD;  Location: [facility];  Service: Urology;  Laterality: N/A;    FAMILY HISTORY: Family History  Problem Relation Age of Onset   Alzheimer's disease Mother    Alcohol abuse Father    Brain cancer Paternal Grandfather    Colon cancer Neg Hx    Esophageal cancer Neg Hx    Stomach cancer Neg Hx    Rectal cancer Neg Hx     SOCIAL HISTORY: Social History   Socioeconomic History   Marital status: Married    Spouse name: Not on file   Number of children: 3   Years of education: Not on file   Highest education level: Not on file  Occupational History   Occupation: retired  Tobacco Use   Smoking status: Some Days    Current packs/day: 0.00    Types: Cigarettes, Cigars    Last attempt to quit: 1985    Years since quitting: 40.4   Smokeless tobacco: Never   Tobacco comments:    12-21-2021  per pt quit cigarettes 1985 for 20 yrs ,  now occasional cigar when play golf  Vaping Use   Vaping status: Never Used  Substance and Sexual Activity   Alcohol use: No    Alcohol/week: 0.0 standard drinks of  alcohol   Drug use: Never   Sexual activity: Not on file  Other Topics Concern   Not on file  Social History Narrative   Not on file   Social Drivers of Health   Financial Resource Strain: Not on file  Food Insecurity: Not on file  Transportation Needs: Not on file  Physical Activity: Not on file  Stress: Not on file  Social Connections:  Not on file  Intimate Partner Violence: Not on file    PHYSICAL EXAM  GENERAL EXAM/CONSTITUTIONAL: Vitals:  Vitals:   02/26/24 0929  BP: (!) 140/71  Pulse: 74  Weight: 150 lb 8 oz (68.3 kg)  Height: 5\' 6"  (1.676 m)   Body mass index is 24.29 kg/m. Wt Readings from Last 3 Encounters:  02/26/24 150 lb 8 oz (68.3 kg)  12/04/23 156 lb (70.8 kg)  05/12/23 157 lb (71.2 kg)   Patient is in no distress; well developed, nourished and groomed; neck is supple  MUSCULOSKELETAL: Gait, strength, tone, movements noted in Neurologic exam below  NEUROLOGIC: MENTAL STATUS:      No data to display         awake, alert, oriented to person, place and time recent and remote memory intact normal attention and concentration language fluent, comprehension intact, naming intact fund of knowledge appropriate  CRANIAL NERVE:  2nd, 3rd, 4th, 6th - Visual fields full to confrontation, extraocular muscles intact, no nystagmus 5th - facial sensation symmetric 7th - facial strength symmetric 8th - hearing intact 9th - palate elevates symmetrically, uvula midline 11th - shoulder shrug symmetric 12th - tongue protrusion midline  MOTOR:  normal bulk and tone, full strength in the BUE, BLE  SENSORY:  normal and symmetric to light touch  COORDINATION:  finger-nose-finger, fine finger movements normal. Bilateral action tremors   GAIT/STATION:  normal   DIAGNOSTIC DATA (LABS, IMAGING, TESTING) - I reviewed patient records, labs, notes, testing and imaging myself where available.  Lab Results  Component Value Date   WBC 7.3 08/31/2019   HGB  13.9 08/31/2019   HCT 43.1 08/31/2019   MCV 93.7 08/31/2019   PLT 240 08/31/2019      Component Value Date/Time   NA 139 08/31/2019 0855   K 4.2 08/31/2019 0855   CL 106 08/31/2019 0855   CO2 27 08/31/2019 0855   GLUCOSE 115 (H) 08/31/2019 0855   BUN 20 08/31/2019 0855   CREATININE 1.00 08/31/2019 0855   CALCIUM 9.6 08/31/2019 0855   GFRNONAA >60 08/31/2019 0855   GFRAA >60 08/31/2019 0855   No results found for: "CHOL", "HDL", "LDLCALC", "LDLDIRECT", "TRIG", "CHOLHDL" No results found for: "HGBA1C" No results found for: "VITAMINB12" No results found for: "TSH"    ASSESSMENT AND PLAN  78 y.o. year old male with history of GERD, hyperlipidemia, insomnia, vertigo who is presenting with new onset of double vision lasting less than a minute.  On exam his extraocular movement movements are intact, visual field full.  There is no associated headache.  Plan will be to obtain a MRI brain with and without contrast to rule out intracranial abnormality that can explain the intermittent diplopia.  If normal, I will advise patient to continue following up with PCP and ophthalmology.  Return sooner if worse.  He voiced understanding. For the action tremors noted on exam, he reports that he had 4 cups of coffee this morning without eating.    1. Diplopia     Patient Instructions  MRI Brain with and without contrast, recent labs at Doctor office in December BUN 24, creatinine 1.49 and eGFR 63 I will contact you to go over the results  Continue to follow up with your doctors  Return as needed   Orders Placed This Encounter  Procedures   MR BRAIN W WO CONTRAST    No orders of the defined types were placed in this encounter.   Return if symptoms worsen or fail  to improve.    Cassandra Cleveland, MD 02/26/2024, 10:53 AM  [facility] [address], [address] [place], [place] [address] [phone]

## 2024-03-25 ENCOUNTER — Encounter: Payer: Self-pay | Admitting: Neurology

## 2024-04-02 ENCOUNTER — Ambulatory Visit
Admission: RE | Admit: 2024-04-02 | Discharge: 2024-04-02 | Disposition: A | Discharge status: Home / Self Care | Source: Ambulatory Visit | Attending: Neurology

## 2024-04-02 DIAGNOSIS — H532 Diplopia: Secondary | ICD-10-CM | POA: Diagnosis not present

## 2024-04-02 MED ORDER — GADOPICLENOL 0.5 MMOL/ML IV SOLN
7.5000 mL | Freq: Once | INTRAVENOUS | Status: AC | PRN
  Administered 2024-04-02: 7.5 mL via INTRAVENOUS

## 2024-04-03 ENCOUNTER — Ambulatory Visit: Payer: Self-pay | Admitting: Neurology

## 2024-05-02 DIAGNOSIS — Z8546 Personal history of malignant neoplasm of prostate: Secondary | ICD-10-CM | POA: Diagnosis not present

## 2024-05-02 DIAGNOSIS — R350 Frequency of micturition: Secondary | ICD-10-CM | POA: Diagnosis not present

## 2024-06-13 DIAGNOSIS — S46811D Strain of other muscles, fascia and tendons at shoulder and upper arm level, right arm, subsequent encounter: Secondary | ICD-10-CM | POA: Diagnosis not present

## 2024-06-21 DIAGNOSIS — H02882 Meibomian gland dysfunction right lower eyelid: Secondary | ICD-10-CM | POA: Diagnosis not present

## 2024-06-21 DIAGNOSIS — H02885 Meibomian gland dysfunction left lower eyelid: Secondary | ICD-10-CM | POA: Diagnosis not present

## 2024-06-21 DIAGNOSIS — H18413 Arcus senilis, bilateral: Secondary | ICD-10-CM | POA: Diagnosis not present

## 2024-06-21 DIAGNOSIS — D3131 Benign neoplasm of right choroid: Secondary | ICD-10-CM | POA: Diagnosis not present

## 2024-06-21 DIAGNOSIS — H2513 Age-related nuclear cataract, bilateral: Secondary | ICD-10-CM | POA: Diagnosis not present

## 2024-09-09 DIAGNOSIS — H532 Diplopia: Secondary | ICD-10-CM | POA: Diagnosis not present

## 2024-09-09 DIAGNOSIS — E7800 Pure hypercholesterolemia, unspecified: Secondary | ICD-10-CM | POA: Diagnosis not present

## 2024-09-09 DIAGNOSIS — Z125 Encounter for screening for malignant neoplasm of prostate: Secondary | ICD-10-CM | POA: Diagnosis not present

## 2024-09-12 DIAGNOSIS — E559 Vitamin D deficiency, unspecified: Secondary | ICD-10-CM | POA: Diagnosis not present

## 2024-09-12 DIAGNOSIS — R7989 Other specified abnormal findings of blood chemistry: Secondary | ICD-10-CM | POA: Diagnosis not present

## 2024-09-29 ENCOUNTER — Other Ambulatory Visit: Payer: Self-pay | Admitting: Radiation Oncology

## 2024-10-27 ENCOUNTER — Other Ambulatory Visit: Payer: Self-pay | Admitting: Radiation Oncology
